# Patient Record
Sex: Female | Born: 1990 | Race: Black or African American | Hispanic: No | Marital: Single | Smoking: Never smoker
Health system: Southern US, Community
[De-identification: ages and names within clinical notes are randomized; demographics above are authoritative.]

## PROBLEM LIST (undated history)

## (undated) ENCOUNTER — Inpatient Hospital Stay (HOSPITAL_COMMUNITY): Payer: Self-pay

## (undated) DIAGNOSIS — E119 Type 2 diabetes mellitus without complications: Secondary | ICD-10-CM

---

## 2012-07-05 ENCOUNTER — Encounter (HOSPITAL_COMMUNITY): Payer: Self-pay | Admitting: Emergency Medicine

## 2012-07-05 ENCOUNTER — Emergency Department (INDEPENDENT_AMBULATORY_CARE_PROVIDER_SITE_OTHER)
Admission: EM | Admit: 2012-07-05 | Discharge: 2012-07-05 | Disposition: A | Discharge status: Home / Self Care | Payer: BC Managed Care – PPO | Source: Home / Self Care | Attending: Family Medicine | Admitting: Family Medicine

## 2012-07-05 ENCOUNTER — Other Ambulatory Visit (HOSPITAL_COMMUNITY)
Admission: RE | Admit: 2012-07-05 | Discharge: 2012-07-05 | Disposition: A | Discharge status: Home / Self Care | Payer: BC Managed Care – PPO | Source: Ambulatory Visit | Attending: Family Medicine | Admitting: Family Medicine

## 2012-07-05 DIAGNOSIS — Z113 Encounter for screening for infections with a predominantly sexual mode of transmission: Secondary | ICD-10-CM | POA: Insufficient documentation

## 2012-07-05 DIAGNOSIS — A6009 Herpesviral infection of other urogenital tract: Secondary | ICD-10-CM

## 2012-07-05 DIAGNOSIS — N760 Acute vaginitis: Secondary | ICD-10-CM | POA: Insufficient documentation

## 2012-07-05 DIAGNOSIS — A6000 Herpesviral infection of urogenital system, unspecified: Secondary | ICD-10-CM

## 2012-07-05 HISTORY — DX: Type 2 diabetes mellitus without complications: E11.9

## 2012-07-05 LAB — POCT URINALYSIS DIP (DEVICE)
Bilirubin Urine: NEGATIVE
Glucose, UA: 250 mg/dL — AB
Specific Gravity, Urine: >=1.03 (ref 1.005–1.030)

## 2012-07-05 LAB — POCT PREGNANCY, URINE: Preg Test, Ur: NEGATIVE

## 2012-07-05 MED ORDER — VALACYCLOVIR HCL 1 G PO TABS: 1000.0000 mg | ORAL_TABLET | Freq: Two times a day (BID) | ORAL | Status: AC

## 2012-07-05 NOTE — ED Notes (Signed)
Pt c/o a grayish vag discharge onset Thursday Sx also include: vag swelling and blisters that are painful Also c/o abscess on pubic area Denies: f/v/n/d, hematuria, dysuria LMP: 06/30/12  She is alert and oriented w/no signs of acute distress.

## 2012-07-05 NOTE — ED Notes (Signed)
Call back number verified.  

## 2012-07-05 NOTE — ED Provider Notes (Signed)
History     CSN: [id number]  Arrival date & time 07/05/12  1122   First MD Initiated Contact with Patient 07/05/12 1125      Chief Complaint  Patient presents with  . Vaginal Discharge    HPI Patient is a 22 y.o. female presenting with rash. The history is provided by the patient.  Rash Location:  Ano-genital Ano-genital rash location:  Vulva Quality: blistering, burning, painful and redness   Quality: not draining, not dry and not itchy   Pain details:    Quality:  Sore and stinging   Onset quality:  Sudden   Progression:  Worsening Severity:  Severe Timing:  Constant Progression:  Unchanged Relieved by:  None tried Ineffective treatments:  None tried Associated symptoms: no abdominal pain and no fever    Patient reports 2 day history of painful vaginal lesions. Stay she was recently treated for BV and yeast. Patient denies any history of these vaginal lesions in the past. Admits to unprotected intercourse with a single partner over the last 3 months. Patient also reports painful swollen nodules in bilateral inguinal area since onset of symptoms.   Past Medical History  Diagnosis Date  . Diabetes mellitus without complication     History reviewed. No pertinent past surgical history.  No family history on file.  History  Substance Use Topics  . Smoking status: Never Smoker   . Smokeless tobacco: Not on file  . Alcohol Use: No    OB History   Grav Para Term Preterm Abortions TAB SAB Ect Mult Living                  Review of Systems  Constitutional: Negative for fever.  Gastrointestinal: Negative for abdominal pain.  Skin: Positive for rash.  All other systems reviewed and are negative.    Allergies  Review of patient's allergies indicates no known allergies.  Home Medications   Current Outpatient Rx  Name  Route  Sig  Dispense  Refill  . insulin glargine (LANTUS) 100 UNIT/ML injection   Subcutaneous   Inject into the skin at bedtime.          . insulin lispro (HUMALOG) 100 UNIT/ML injection   Subcutaneous   Inject into the skin 3 (three) times daily before meals.           BP 122/83  Pulse 110  Temp(Src) 99.6 F (37.6 C) (Oral)  Resp 18  SpO2 98%  LMP 06/30/2012  Physical Exam  Constitutional: She appears well-developed and well-nourished.  HENT:  Head: Normocephalic and atraumatic.  Eyes: Conjunctivae are normal.  Neck: Neck supple.  Cardiovascular: Normal rate.   Pulmonary/Chest: Effort normal.  Genitourinary: Uterus normal. No labial fusion. There is rash, tenderness and lesion on the right labia. There is no injury on the right labia. There is rash, tenderness and lesion on the left labia. There is no injury on the left labia. Cervix exhibits no motion tenderness, no discharge and no friability. Right adnexum displays no mass, no tenderness and no fullness. Left adnexum displays no mass, no tenderness and no fullness. There is bleeding around the vagina.  Multiple open vesicular lesion to bilateral labia majora labia minora c/w genital herpes.  Lymphadenopathy:       Right: Inguinal adenopathy present.       Left: Inguinal adenopathy present.    ED Course  Pelvic exam Date/Time: 07/05/2012 1:41 PM Performed by: Leanne Chang Authorized by: Bradd Canary D Consent: Verbal consent obtained.  Risks and benefits: risks, benefits and alternatives were discussed Consent given by: patient Patient understanding: patient states understanding of the procedure being performed Required items: required blood products, implants, devices, and special equipment available Patient identity confirmed: verbally with patient and arm band Preparation: Patient was prepped and draped in the usual sterile fashion. Local anesthesia used: no Patient sedated: no Patient tolerance: Patient tolerated the procedure well with no immediate complications.   (including critical care time)  Labs Reviewed  POCT URINALYSIS DIP  (DEVICE) - Abnormal; Notable for the following:    Glucose, UA 250 (*)    Hgb urine dipstick LARGE (*)    Protein, ur 100 (*)    All other components within normal limits  POCT PREGNANCY, URINE   No results found.   No diagnosis found.    MDM  2 day history of painful vaginal lesions. PE consistent with initial outbreak of genital herpes. Remaining PE unremarkable. Will treat with 10 days of Valtrex and encourage patient to keep her scheduled appointment at [facility] for followup. Safer sex and STIR information provided. Patient verbalizes understanding of instructions and is agreeable.        Leanne Chang, NP 07/05/12 1407

## 2012-07-07 NOTE — ED Provider Notes (Addendum)
Medical screening examination/treatment/procedure(s) were performed by resident physician or non-physician practitioner and as supervising physician I was immediately available for consultation/collaboration.   Barkley Bruns MD.   Linna Hoff, MD 07/07/12 8413  Linna Hoff, MD 07/10/12 [phone]

## 2012-07-10 ENCOUNTER — Telehealth (HOSPITAL_COMMUNITY): Payer: Self-pay | Admitting: *Deleted

## 2012-07-10 MED ORDER — METRONIDAZOLE 500 MG PO TABS: 500.0000 mg | ORAL_TABLET | Freq: Two times a day (BID) | ORAL | Status: DC

## 2012-07-10 NOTE — ED Notes (Signed)
GC/Chlamydia neg., Affirm: Gardnerella pos., Candida and Trich neg.  Order obtained from Dr. Artis Flock for Flagyl.  I called pt. Pt. verified x 2 and given results.  Pt. told she needs Flagyl for bacterial vaginosis.   Pt. instructed to no alcohol while taking this medication.  Pt. wants Rx. called to the [facility] on [address].  Pt. was treated for Herpes but did not get a culture. Pt. instructed to notify her partner. You can pass the virus even when you don't have an outbreak, so always practice safe sex. Get treated for each outbreak with Acyclovir or Valtrex. You may want to get an OB-GYN doctor who can call in a prescription for you when you have an outbreak. Pt. said she has already done that.  Rx. called to [facility] at [phone]. Vassie Moselle 07/10/2012

## 2012-07-14 NOTE — ED Notes (Signed)
Chart review.

## 2012-07-15 ENCOUNTER — Telehealth (HOSPITAL_COMMUNITY): Payer: Self-pay | Admitting: *Deleted

## 2012-07-15 NOTE — ED Notes (Signed)
Pt. called back and is questioning her diagnosis.  She asked to speak to Tama Gander NP. I told her she is part time and is not here now. I asked why she does not accept the diagnosis. She said she told her partner and got tested and it was neg. I asked if he ever had a lesion and she said no.  I asked if he got he HSV 1 and HSV 2 and she said yes and they were neg.  I accessed her chart and told her that Tama Gander made her diagnosis based on her exam. I asked if she got better with the medication and she said yes. I asked if she made a f/u appt with the OB-GYN and she said yes on 5/22. I told her to discuss it with the doctor. Explain that the diagnosis we made was on the clinical exam and no culture was done.  The doctor can check her HSV 1 and 2 and confirm if it was herpes or if she gets another outbreak she can get it cultured at that time. I told her to practice safe sex and always make her partner wear a condom because she can pass ever when she does not have an outbreak.  Pt. voiced understanding. Vassie Moselle 07/15/2012

## 2012-12-03 ENCOUNTER — Ambulatory Visit: Payer: BC Managed Care – PPO | Admitting: *Deleted

## 2012-12-19 ENCOUNTER — Encounter: Payer: BC Managed Care – PPO | Attending: Internal Medicine | Admitting: *Deleted

## 2012-12-19 ENCOUNTER — Encounter: Payer: Self-pay | Admitting: *Deleted

## 2012-12-19 VITALS — Ht 65.5 in | Wt 114.8 lb

## 2012-12-19 DIAGNOSIS — E1065 Type 1 diabetes mellitus with hyperglycemia: Secondary | ICD-10-CM | POA: Insufficient documentation

## 2012-12-19 DIAGNOSIS — Z713 Dietary counseling and surveillance: Secondary | ICD-10-CM | POA: Insufficient documentation

## 2012-12-19 DIAGNOSIS — IMO0002 Reserved for concepts with insufficient information to code with codable children: Secondary | ICD-10-CM

## 2012-12-19 NOTE — Progress Notes (Signed)
Appt start time: 0830 end time:  1000.  Assessment:  Patient was seen on  12/19/12 for individual diabetes education. 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 She lives with room mate in apartment, but she shops and cooks for herself. 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 Works at [employer] with variable hours. SMBG 3-5 times a day. Has been on Animus pump in the past, prefers pens at this time.   Current HbA1c: 13.7% on 02/06/2012  MEDICATIONS: see list. Diabetes medications are Lantus and Humalog Kwikpen   DIETARY INTAKE:  24-hr recall:  B ( AM): omelet with lots of veggies and meat in it OR grits, eggs OR pancakes OR oatmeal with sugar and butter vs. Flavored, water to drink Snk ( AM): varies - granola bar OR cookies OR chips  L ( PM): at home more often lately: left overs OR sandwich with fruit, water Snk ( PM): varies, same as AM D ( PM): meat, starch, vegetables OR casserole meal,  Snk ( PM): not usually, due to late dinner Beverages: water or sports drinks  Usual physical activity: walks to bus stop for work and school  Estimated energy needs: 1500 calories 170 g carbohydrates 112 g protein 42 g fat  Progress Towards Goal(s):  In progress.   Nutritional Diagnosis:  NB-1.1 Food and nutrition-related knowledge deficit As related to control diabetes.  As evidenced by A1c of 13.7%.    Intervention:  Nutrition counseling provided.  Discussed diabetes disease process and treatment options.  Discussed physiology of diabetes and difference between type 1 and type 2. Discussed role of medications and diet in glucose control  Provided education on macronutrients on glucose levels.  Provided education on carb counting, importance of regularly scheduled meals/snacks, and meal planning  Discussed effects of physical activity on glucose levels and long-term glucose control.    Review patient  medications.  Discussed role of medication on blood glucose and possible side effects  Discussed blood glucose monitoring and interpretation.  Discussed recommended target ranges and individual ranges.    Described short-term complications: hyper- and hypo-glycemia.  Discussed causes,symptoms, and treatment options.  At next visit plan to discuss:  Prevention, detection, and treatment of long-term complications.  Discussed the role of prolonged elevated glucose levels on body systems.  Role of stress on blood glucose levels and discussed strategies to manage psychosocial issues.  Recommendations for long-term diabetes self-care.  Established checklist for medical, dental, and emotional self-care.  We discussed the potential for Dexcom CGM, she is not ready to pursue that at this time. Will discuss further at next visit.  Handouts given during visit include: Living Well with Diabetes Carb Counting and Food Label handouts Meal Plan Card  Plan:  Consider using pure carbohydrate for treating low BG rather than higher fat foods Continue reading food labels for Total Carbohydrate and Fat Grams of foods Continue with your current activity level by walking daily as tolerated Consider cooking larger portions of meat or other parts of meal to freeze and save time in the kitchen  Barriers to learning/adherance to lifestyle change: busy schedule and probable denial of chronic disease management needs  Diabetes self-care support plan:   [facility] DM1 support group  Monitoring/Evaluation:  Dietary intake, exercise, SMBG, and body weight in 2 month(s).

## 2012-12-19 NOTE — Patient Instructions (Signed)
Plan:  Consider using pure carbohydrate for treating low BG rather than higher fat foods Continue reading food labels for Total Carbohydrate and Fat Grams of foods Continue with your current activity level by walking daily as tolerated Consider cooking larger portions of meat or other parts of meal to freeze and save time in the kitchen

## 2013-02-18 ENCOUNTER — Ambulatory Visit: Payer: BC Managed Care – PPO | Admitting: *Deleted

## 2013-03-06 ENCOUNTER — Encounter: Payer: BC Managed Care – PPO | Attending: Internal Medicine | Admitting: *Deleted

## 2013-03-06 DIAGNOSIS — IMO0002 Reserved for concepts with insufficient information to code with codable children: Secondary | ICD-10-CM

## 2013-03-06 DIAGNOSIS — Z713 Dietary counseling and surveillance: Secondary | ICD-10-CM | POA: Insufficient documentation

## 2013-03-06 DIAGNOSIS — E1065 Type 1 diabetes mellitus with hyperglycemia: Secondary | ICD-10-CM | POA: Insufficient documentation

## 2013-03-06 NOTE — Progress Notes (Signed)
Appt start time: 0830 end time:  0930.  Assessment:  Patient was seen on  03/06/13 for individual diabetes education follow up visit. She states she has met with Dr. Talmage Coin and he has explained that with her complicated work schedule that going back on the insulin pump could be very helpful in improving her control. She tells me that when on the Animas pump before, she used the basic bolus features but not any advanced features such as Combo Bolus, Temporary Basal etc. Her difficulty with the pump in the past was scar tissue made the site painful. She would also like to know more about the new technology available with current pumps. She states she has had an A1c recently and it has improved by 2% points, she did not give me the actual number at this time.  Current HbA1c: 13.7% on 02/06/2012, she states it has improved by 2 % this past month  MEDICATIONS: see list. Diabetes medications are Lantus and Humalog Kwikpen   DIETARY INTAKE:  24-hr recall:  B ( AM): omelet with lots of veggies and meat in it OR grits, eggs OR pancakes OR oatmeal with sugar and butter vs. Flavored, water to drink Snk ( AM): varies - granola bar OR cookies OR chips  L ( PM): at home more often lately: left overs OR sandwich with fruit, water Snk ( PM): varies, same as AM D ( PM): meat, starch, vegetables OR casserole meal,  Snk ( PM): not usually, due to late dinner Beverages: water or sports drinks  Usual physical activity: walks to bus stop for work and school  Estimated energy needs: 1500 calories 170 g carbohydrates 112 g protein 42 g fat  Progress Towards Goal(s):  In progress.   Nutritional Diagnosis:  NB-1.1 Food and nutrition-related knowledge deficit As related to control diabetes.  As evidenced by A1c of 13.7%.    Intervention:    Reviewed difference between delivery of insulin via syringe/pen compared to insulin pump. Reviewed improved insulin delivery via pump due to improved accuracy of  dose and flexibility of adjusting bolus insulin based on carb intake and BG correction. Described advanced features including Temp Basal, Patterns, Combo Bolus for high fat meals and Insulin On Board Discussed potential CGM from either Dexcom with the new Vibe Animas Pump and Medtronic 530G with Enlyte sensors. Explained importance of testing BG at least 4 times per day for appropriate correction of high BG and prevention of DKA as applicable. Discussed snack options per her request, some of which would not require insulin.  Monitoring/Evaluation:   Patient does want to continue with pursuit of insulin pump.  Plan:  Patient instructed to contact both pump companies to determine her out of pocket cost based on her insurance coverage. Patient instructed to go to both pump web-sites for more detailed information.  I have provided you with a Snack List, (the protein and fat will help with hunger without needing insulin).   Barriers to learning/adherance to lifestyle change: appears improved since last visit  Diabetes self-care support plan:   [facility] DM1 support group  Monitoring/Evaluation:  Dietary intake, exercise, SMBG, and body weight PRN, and to contact me for training date when pump / CGM is ordered.

## 2013-03-06 NOTE — Patient Instructions (Signed)
Plan: Consider calling Yvonne Kendall to find out your insurance coverage for both the Ping and the Vibe insulin pumps Also ask about coverage for sensors (Continuous Glucose Monitoring = CGM) I have provided you with a Snack List, (the protein and fat will help with hunger without needing insulin).

## 2014-01-12 ENCOUNTER — Ambulatory Visit (INDEPENDENT_AMBULATORY_CARE_PROVIDER_SITE_OTHER): Payer: BC Managed Care – HMO | Admitting: Physician Assistant

## 2014-01-12 VITALS — BP 112/78 | HR 71 | Temp 98.4°F | Resp 18 | Ht 65.0 in | Wt 122.4 lb

## 2014-01-12 DIAGNOSIS — E10628 Type 1 diabetes mellitus with other skin complications: Secondary | ICD-10-CM

## 2014-01-12 DIAGNOSIS — E109 Type 1 diabetes mellitus without complications: Secondary | ICD-10-CM | POA: Insufficient documentation

## 2014-01-12 DIAGNOSIS — IMO0002 Reserved for concepts with insufficient information to code with codable children: Secondary | ICD-10-CM

## 2014-01-12 DIAGNOSIS — K651 Peritoneal abscess: Secondary | ICD-10-CM

## 2014-01-12 LAB — POCT CBC
GRANULOCYTE PERCENT: 59.2 % (ref 37–80)
HEMATOCRIT: 44 % (ref 37.7–47.9)
Hemoglobin: 13.7 g/dL (ref 12.2–16.2)
Lymph, poc: 3.3 (ref 0.6–3.4)
MCH, POC: 28.8 pg (ref 27–31.2)
MCHC: 31.1 g/dL — AB (ref 31.8–35.4)
MCV: 92.8 fL (ref 80–97)
MID (cbc): 0.6 (ref 0–0.9)
MPV: 10.4 fL (ref 0–99.8)
POC GRANULOCYTE: 5.6 (ref 2–6.9)
POC LYMPH %: 35 % (ref 10–50)
POC MID %: 5.8 %M (ref 0–12)
Platelet Count, POC: 304 10*3/uL (ref 142–424)
RBC: 4.74 M/uL (ref 4.04–5.48)
RDW, POC: 15.9 %
WBC: 9.5 10*3/uL (ref 4.6–10.2)

## 2014-01-12 LAB — GLUCOSE, POCT (MANUAL RESULT ENTRY): POC GLUCOSE: 271 mg/dL — AB (ref 70–99)

## 2014-01-12 MED ORDER — DOXYCYCLINE HYCLATE 100 MG PO CAPS: 100.0000 mg | ORAL_CAPSULE | Freq: Two times a day (BID) | ORAL | Status: DC

## 2014-01-12 NOTE — Progress Notes (Signed)
Subjective:    Patient ID: Sara Warren, female    DOB: 10/17/1990, 23 y.o.   MRN: [id number]  HPI Patient with PMH of type 1 diabetes controlled with insulin pump presents with a 1 week painful bump former injection site. Removed 7-8 days ago. Has not seen any pus. Has changed pump twice since has had bump and home glucose has been higher than usual with numbers in the 400 and 500s. Usual numbers are in 200; goal is <250. Denies fever, sweating, fatigue, HA, or N/V. No recent changes in diet. No med allergies.   Review of Systems  Constitutional: Negative for fever, chills, diaphoresis, activity change, appetite change and fatigue.  Eyes: Negative for photophobia and visual disturbance.  Respiratory: Negative for shortness of breath.   Cardiovascular: Negative for chest pain and palpitations.  Gastrointestinal: Positive for constipation (baseline). Negative for nausea, vomiting, abdominal pain, diarrhea and abdominal distention.  Skin: Negative for pallor, rash and wound.       Bump on lower abdomen. No pus.  Allergic/Immunologic: Negative for environmental allergies and food allergies.  Neurological: Negative for dizziness, weakness, light-headedness and headaches.  Hematological: Negative for adenopathy.       Objective:   Physical Exam  Constitutional: She is oriented to person, place, and time. She appears well-developed. No distress.  Blood pressure 112/78, pulse 71, temperature 98.4 F (36.9 C), temperature source Oral, resp. rate 18, height 5\' 5"  (1.651 m), weight 122 lb 6.4 oz (55.52 kg), SpO2 98.00%.   HENT:  Head: Normocephalic and atraumatic.  Eyes: Conjunctivae are normal. Pupils are equal, round, and reactive to light. Right eye exhibits no discharge. Left eye exhibits no discharge.  Neck: Neck supple.  Cardiovascular: Normal rate, regular rhythm and normal heart sounds.  Exam reveals no gallop and no friction rub.   No murmur heard. Pulmonary/Chest: Effort normal and  breath sounds normal.  Abdominal: Soft. Bowel sounds are normal. She exhibits no distension and no mass. There is tenderness (at location of lesion only).  Lymphadenopathy:    She has no cervical adenopathy.  Neurological: She is alert and oriented to person, place, and time.  Skin: Skin is warm and dry. No rash noted. She is not diaphoretic. No erythema. No pallor.  3cm lesion that is raised and not erythematous. Hard without any central fluctuance.    Results for orders placed in visit on 01/12/14  POCT CBC      Result Value Ref Range   WBC 9.5  4.6 - 10.2 K/uL   Lymph, poc 3.3  0.6 - 3.4   POC LYMPH PERCENT 35.0  10 - 50 %L   MID (cbc) 0.6  0 - 0.9   POC MID % 5.8  0 - 12 %M   POC Granulocyte 5.6  2 - 6.9   Granulocyte percent 59.2  37 - 80 %G   RBC 4.74  4.04 - 5.48 M/uL   Hemoglobin 13.7  12.2 - 16.2 g/dL   HCT, POC 40.944.0  81.137.7 - 47.9 %   MCV 92.8  80 - 97 fL   MCH, POC 28.8  27 - 31.2 pg   MCHC 31.1 (*) 31.8 - 35.4 g/dL   RDW, POC 91.415.9     Platelet Count, POC 304  142 - 424 K/uL   MPV 10.4  0 - 99.8 fL  GLUCOSE, POCT (MANUAL RESULT ENTRY)      Result Value Ref Range   POC Glucose 271 (*) 70 - 99 mg/dl  Assessment & Plan:  1. Abdominal abscess - POCT CBC - Doxycycline 100 mg bid x 10days. - RTC if fever develops or area becomes red, warm, or has pus.  2. 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 - POCT glucose (manual entry) - Encouraged to contact endocrinologist tomorrow since home glucose is not a baseline.    Janan Ridgeishira Fount Bahe PA-C  [facility] 01/12/2014 8:55 PM

## 2014-02-14 ENCOUNTER — Emergency Department (HOSPITAL_COMMUNITY)
Admission: EM | Admit: 2014-02-14 | Discharge: 2014-02-14 | Disposition: A | Discharge status: Home / Self Care | Payer: BC Managed Care – PPO | Attending: Emergency Medicine | Admitting: Emergency Medicine

## 2014-02-14 ENCOUNTER — Encounter (HOSPITAL_COMMUNITY): Payer: Self-pay | Admitting: Emergency Medicine

## 2014-02-14 DIAGNOSIS — Z794 Long term (current) use of insulin: Secondary | ICD-10-CM | POA: Diagnosis not present

## 2014-02-14 DIAGNOSIS — Z79899 Other long term (current) drug therapy: Secondary | ICD-10-CM | POA: Diagnosis not present

## 2014-02-14 DIAGNOSIS — E1165 Type 2 diabetes mellitus with hyperglycemia: Secondary | ICD-10-CM | POA: Insufficient documentation

## 2014-02-14 DIAGNOSIS — T7840XA Allergy, unspecified, initial encounter: Secondary | ICD-10-CM

## 2014-02-14 DIAGNOSIS — R739 Hyperglycemia, unspecified: Secondary | ICD-10-CM

## 2014-02-14 LAB — CBC WITH DIFFERENTIAL/PLATELET
BASOS ABS: 0 10*3/uL (ref 0.0–0.1)
Basophils Relative: 0 % (ref 0–1)
Eosinophils Absolute: 0 10*3/uL (ref 0.0–0.7)
Eosinophils Relative: 0 % (ref 0–5)
HEMATOCRIT: 41.7 % (ref 36.0–46.0)
Hemoglobin: 13.8 g/dL (ref 12.0–15.0)
LYMPHS PCT: 31 % (ref 12–46)
Lymphs Abs: 2.6 10*3/uL (ref 0.7–4.0)
MCH: 29.1 pg (ref 26.0–34.0)
MCHC: 33.1 g/dL (ref 30.0–36.0)
MCV: 88 fL (ref 78.0–100.0)
Monocytes Absolute: 0.4 10*3/uL (ref 0.1–1.0)
Monocytes Relative: 5 % (ref 3–12)
NEUTROS ABS: 5.3 10*3/uL (ref 1.7–7.7)
Neutrophils Relative %: 64 % (ref 43–77)
PLATELETS: 376 10*3/uL (ref 150–400)
RBC: 4.74 MIL/uL (ref 3.87–5.11)
RDW: 13 % (ref 11.5–15.5)
WBC: 8.3 10*3/uL (ref 4.0–10.5)

## 2014-02-14 LAB — COMPREHENSIVE METABOLIC PANEL
ALT: 9 U/L (ref 0–35)
AST: 15 U/L (ref 0–37)
Albumin: 3.7 g/dL (ref 3.5–5.2)
Alkaline Phosphatase: 66 U/L (ref 39–117)
Anion gap: 15 (ref 5–15)
BILIRUBIN TOTAL: 0.4 mg/dL (ref 0.3–1.2)
BUN: 13 mg/dL (ref 6–23)
CHLORIDE: 100 meq/L (ref 96–112)
CO2: 22 meq/L (ref 19–32)
Calcium: 9 mg/dL (ref 8.4–10.5)
Creatinine, Ser: 0.81 mg/dL (ref 0.50–1.10)
GFR calc Af Amer: >90 mL/min (ref >90)
Glucose, Bld: 291 mg/dL — ABNORMAL HIGH (ref 70–99)
POTASSIUM: 3.8 meq/L (ref 3.7–5.3)
SODIUM: 137 meq/L (ref 137–147)
Total Protein: 7.5 g/dL (ref 6.0–8.3)

## 2014-02-14 LAB — CBG MONITORING, ED: Glucose-Capillary: 211 mg/dL — ABNORMAL HIGH (ref 70–99)

## 2014-02-14 LAB — I-STAT BETA HCG BLOOD, ED (MC, WL, AP ONLY): I-stat hCG, quantitative: <5 m[IU]/mL (ref <5)

## 2014-02-14 MED ORDER — SODIUM CHLORIDE 0.9 % IV BOLUS (SEPSIS)
2000.0000 mL | Freq: Once | INTRAVENOUS | Status: AC
  Administered 2014-02-14: 2000 mL via INTRAVENOUS

## 2014-02-14 MED ORDER — PREDNISONE 20 MG PO TABS: ORAL_TABLET | ORAL | Status: DC

## 2014-02-14 MED ORDER — FAMOTIDINE IN NACL 20-0.9 MG/50ML-% IV SOLN
20.0000 mg | Freq: Once | INTRAVENOUS | Status: AC
  Administered 2014-02-14: 20 mg via INTRAVENOUS
  Filled 2014-02-14: qty 50

## 2014-02-14 MED ORDER — METHYLPREDNISOLONE SODIUM SUCC 125 MG IJ SOLR
125.0000 mg | Freq: Once | INTRAMUSCULAR | Status: AC
  Administered 2014-02-14: 125 mg via INTRAVENOUS
  Filled 2014-02-14: qty 2

## 2014-02-14 MED ORDER — DIPHENHYDRAMINE HCL 50 MG/ML IJ SOLN
25.0000 mg | Freq: Once | INTRAMUSCULAR | Status: AC
  Administered 2014-02-14: 25 mg via INTRAVENOUS
  Filled 2014-02-14 (×2): qty 1

## 2014-02-14 NOTE — ED Notes (Signed)
CBG 211 

## 2014-02-14 NOTE — ED Provider Notes (Signed)
CSN: [id number]     Arrival date & time 02/14/14  0208 History  This chart was scribed for Richardean Canalavid H Melodee Lupe, MD by Freida Busmaniana Omoyeni, ED Scribe. This patient was seen in room B16C/B16C and the patient's care was started 2:14 AM.    Chief Complaint  Patient presents with  . Allergic Reaction     The history is provided by the patient. No language interpreter was used.     HPI Comments:  Sara Warren is a 23 y.o. female who presents to the Emergency Department complaining of a rash to her back, neck and bilateral armpits that she noticed this am. She states that she woke up itching. She denies SOB and pharyngeal edema. She also denies eating new foods, and exposure to new shampoos/lotions. Pt notes her dog has been scratching lately but is unsure if the dog has fleas. No alleviating factors noted.  Pt has h/o DM. She denies fever, chills, and vomiting.    Past Medical History  Diagnosis Date  . Diabetes mellitus without complication    History reviewed. No pertinent past surgical history. Family History  Problem Relation Age of Onset  . Cancer Brother   . Diabetes Paternal Grandmother    History  Substance Use Topics  . Smoking status: Never Smoker   . Smokeless tobacco: Never Used  . Alcohol Use: No   OB History    No data available     Review of Systems  Constitutional: Negative for fever and chills.  Respiratory: Negative for shortness of breath.   Gastrointestinal: Negative for vomiting.  Skin: Positive for rash.       Pruritus   All other systems reviewed and are negative.     Allergies  Review of patient's allergies indicates no known allergies.  Home Medications   Prior to Admission medications   Medication Sig Start Date End Date Taking? Authorizing Provider  insulin aspart (NOVOLOG) 100 unit/mL injection Inject into the skin 3 x daily with food. Units depend on her blood sugar throughout the day   Yes Historical Provider, MD  doxycycline (VIBRAMYCIN) 100 MG  capsule Take 1 capsule (100 mg total) by mouth 2 (two) times daily. 01/12/14   Tishira R Brewington, PA-C  insulin glargine (LANTUS) 100 UNIT/ML injection Inject into the skin at bedtime.    Historical Provider, MD  insulin lispro (HUMALOG) 100 UNIT/ML injection Inject into the skin 3 (three) times daily before meals.    Historical Provider, MD  metroNIDAZOLE (FLAGYL) 500 MG tablet Take 1 tablet (500 mg total) by mouth 2 (two) times daily. 07/10/12   Linna HoffJames D Kindl, MD  Norethindrone Acetate-Ethinyl Estradiol (JUNEL 1.5/30) 1.5-30 MG-MCG tablet Take 1 tablet by mouth daily.    Historical Provider, MD   BP 111/79 mmHg  Pulse 89  Temp(Src) 98.4 F (36.9 C)  Ht 5\' 5"  (1.651 m)  Wt 120 lb (54.432 kg)  BMI 19.97 kg/m2  SpO2 100%  LMP 02/06/2014 (Exact Date) Physical Exam  Constitutional: She is oriented to person, place, and time. She appears well-developed and well-nourished.  HENT:  Head: Normocephalic and atraumatic.  Slightly dry mucous membranes  Eyes: Conjunctivae are normal.  Cardiovascular: Normal rate, regular rhythm and normal heart sounds.   Pulmonary/Chest: Effort normal and breath sounds normal. No stridor.  Abdominal: She exhibits no distension.  Musculoskeletal: She exhibits no edema.  Neurological: She is alert and oriented to person, place, and time.  Skin: Skin is warm and dry.  Hives noted to chin, bilateral  armpits and back  Psychiatric: She has a normal mood and affect.  Nursing note and vitals reviewed.   ED Course  Procedures   DIAGNOSTIC STUDIES:  Oxygen Saturation is 100% on RA, normal by my interpretation.    COORDINATION OF CARE:  2:21 AM Discussed treatment plan with pt at bedside and pt agreed to plan.  Labs Review Labs Reviewed  COMPREHENSIVE METABOLIC PANEL - Abnormal; Notable for the following:    Glucose, Bld 291 (*)    All other components within normal limits  CBG MONITORING, ED - Abnormal; Notable for the following:    Glucose-Capillary  211 (*)    All other components within normal limits  CBC WITH DIFFERENTIAL  I-STAT BETA HCG BLOOD, ED ([facility], [facility], [facility] ONLY)    Imaging Review No results found.   EKG Interpretation None      MDM   Final diagnoses:  None   Sara Warren is a 23 y.o. female here with possible allergic reaction. Has hives, no airway compromise. Glucose was 291 on chemistry, improved with IVF. Not in DKA. Given steroids, benadryl, hives improved. Will d/c home.   I personally performed the services described in this documentation, which was scribed in my presence. The recorded information has been reviewed and is accurate.    Richardean Canalavid H Jasiyah Poland, MD 02/14/14 [phone]

## 2014-02-14 NOTE — Discharge Instructions (Signed)
Take prednisone as prescribed. It may elevate your blood sugar and you need to adjust your pump accordingly.   Take benadryl 25 mg every 6 hrs as needed for itchiness.  Follow up with your doctor.   Return to ER if you have worse itchiness, rash, throat closing.

## 2014-02-14 NOTE — ED Notes (Signed)
Pt. Refused wheelchair 

## 2014-02-14 NOTE — ED Notes (Signed)
New onset hives since about 30 minutes ago, pt not aware of any allergic. No airway compromise at this time, VSS

## 2014-10-13 LAB — OB RESULTS CONSOLE RUBELLA ANTIBODY, IGM: Rubella: IMMUNE

## 2014-10-13 LAB — OB RESULTS CONSOLE ABO/RH: RH TYPE: NEGATIVE

## 2014-10-13 LAB — OB RESULTS CONSOLE RPR: RPR: NONREACTIVE

## 2014-10-13 LAB — OB RESULTS CONSOLE GC/CHLAMYDIA
Chlamydia: NEGATIVE
Gonorrhea: NEGATIVE

## 2014-10-13 LAB — OB RESULTS CONSOLE HEPATITIS B SURFACE ANTIGEN: HEP B S AG: NEGATIVE

## 2014-10-13 LAB — OB RESULTS CONSOLE HIV ANTIBODY (ROUTINE TESTING): HIV: NONREACTIVE

## 2014-10-13 LAB — OB RESULTS CONSOLE ANTIBODY SCREEN: Antibody Screen: NEGATIVE

## 2014-10-21 ENCOUNTER — Ambulatory Visit (HOSPITAL_COMMUNITY)
Admission: RE | Admit: 2014-10-21 | Discharge: 2014-10-21 | Disposition: A | Discharge status: Home / Self Care | Payer: BLUE CROSS/BLUE SHIELD | Source: Ambulatory Visit | Attending: Internal Medicine | Admitting: Internal Medicine

## 2014-10-21 ENCOUNTER — Encounter: Payer: BLUE CROSS/BLUE SHIELD | Attending: Internal Medicine | Admitting: *Deleted

## 2014-10-21 DIAGNOSIS — E109 Type 1 diabetes mellitus without complications: Secondary | ICD-10-CM | POA: Diagnosis not present

## 2014-10-21 DIAGNOSIS — Z3A00 Weeks of gestation of pregnancy not specified: Secondary | ICD-10-CM | POA: Diagnosis not present

## 2014-10-21 DIAGNOSIS — O24011 Pre-existing diabetes mellitus, type 1, in pregnancy, first trimester: Secondary | ICD-10-CM

## 2014-10-21 NOTE — Addendum Note (Signed)
Encounter addended by: Rosendo Gros, RN on: 10/21/2014  4:40 PM<BR>     Documentation filed: Letters

## 2014-10-21 NOTE — Progress Notes (Signed)
Patient presents for Diabetes Consult. Patient has T1DM and pregnant with first child. She notes that in her younger years she was careless in the management of her glucose. She verbalizes the significance of strict glucose management. Her Endocrinologist is Dr. Posey Pronto with [facility]. She has an Animas pump and is awaiting the delivery of her new T-Slim with CGM expected in August. Glucose readings are presently running between 50-213m/dl with an A1c of 7.1%. She sees PPosey Prontoevery 2-2.5 weeks will an appointment tomorrow. Alesha assures me that Dr. PPosey Prontoplans to continue to manage her glucose throughout her pregnancy. Durenda has met with the [facility] with [facility] and received nutritional guidance during her pregnancy. Kerensa has recently started a new job working third shift. Her basal rates in her pump are still on a day shift schedule which I believe is contributing to the highs and lows. Lorita is to discuss this with Dr. PPosey Prontoat her appointment tomorrow (10/21/14). Lavinia requested a schedule to follow with this new work schedule. FBS 1-2pm Eat breakfast            5:00pm  Snack            9:00pm  Lunch            1:00Am  Snack            4:30AM Dinner            9:00AM  Snack and Bed  [facility] Dr. WLisbeth Renshaw

## 2014-10-22 ENCOUNTER — Other Ambulatory Visit (HOSPITAL_COMMUNITY): Payer: Self-pay | Admitting: Obstetrics & Gynecology

## 2014-10-26 ENCOUNTER — Encounter (HOSPITAL_COMMUNITY): Payer: Self-pay

## 2014-10-26 ENCOUNTER — Ambulatory Visit (HOSPITAL_COMMUNITY)
Admission: RE | Admit: 2014-10-26 | Discharge: 2014-10-26 | Disposition: A | Discharge status: Home / Self Care | Payer: BLUE CROSS/BLUE SHIELD | Source: Ambulatory Visit | Attending: Obstetrics & Gynecology | Admitting: Obstetrics & Gynecology

## 2014-10-26 DIAGNOSIS — Z794 Long term (current) use of insulin: Secondary | ICD-10-CM | POA: Diagnosis not present

## 2014-10-26 DIAGNOSIS — O24011 Pre-existing diabetes mellitus, type 1, in pregnancy, first trimester: Secondary | ICD-10-CM | POA: Insufficient documentation

## 2014-10-26 DIAGNOSIS — E109 Type 1 diabetes mellitus without complications: Secondary | ICD-10-CM | POA: Insufficient documentation

## 2014-10-26 DIAGNOSIS — Z3A11 11 weeks gestation of pregnancy: Secondary | ICD-10-CM | POA: Diagnosis not present

## 2014-10-26 NOTE — Consult Note (Signed)
Maternal Fetal Medicine Consultation  Requesting Provider(s): Philip Aspen, DO  Reason for consultation: Type 1 diabetes on insulin pump  HPI: Sara Warren is a 24 yo G2P0010, EDD 05/16/2015 who is currently at 11w 1d seen today for consultation and co-management.  Sara Warren was diagnosed with diabetes in 2009 at age 20 (approx 7 years ago).  She reports that since her diagnosis she has been admitted several times for DKA - most recently in 2013.  She reports ? Diabetic nephropathy - received a call earlier this week that she had significant protein in her 24 hr urine, but results not available at the time of consultation.  Her last evaluation of her eye grounds was several years ago. In the past, she has had very poorly controlled sugars.  In 2014, her HbA1C was in the 13 range.  In April 2016, was 8.7% and most recently 7.1 % in June 2016.  She has an Animas pump - currently being managed by Endocrinology (Dr. Allena Katz).  She is scheduled to get a T-slim pump with continuous glucose monitoring later this month.  Sara Warren reports that Dr. Allena Katz is seeing her about every 2 weeks.  She recently started working 3rd shift and has been having frequent low values.  She was seen by Dr. Allena Katz last week and Basal rates decreased accordingly.  She has a glucagon pen at home and her family members are well-versed on its use.  Her prenatal course has otherwise been uncomplicated.  OB History: OB History    Gravida Para Term Preterm AB TAB SAB Ectopic Multiple Living   1               PMH:  Past Medical History  Diagnosis Date  . Diabetes mellitus without complication   HSV "borderline" elevated TSH - followed by Endocrinology  PSH: neg  Meds: Humolog (insulin pump), Prenatal vitamins, Valtrex  Allergies: No Known Allergies   FH:  Family History  Problem Relation Age of Onset  . Cancer Brother   . Diabetes Paternal Grandmother    Soc:  History   Social History  . Marital Status: Single   Spouse Name: N/A  . Number of Children: N/A  . Years of Education: N/A   Occupational History  . Not on file.   Social History Main Topics  . Smoking status: Never Smoker   . Smokeless tobacco: Never Used  . Alcohol Use: No  . Drug Use: No  . Sexual Activity: Yes    Birth Control/ Protection: Pill, Implant   Other Topics Concern  . Not on file   Social History Narrative    PE:   Filed Vitals:   10/26/14 1021  BP: 131/78  Pulse: 83      A/P: 1) Single IUP at 11w 1d  2) Class F diabetes - Sara Warren was diagnosed with type 1 diabetes in 2009 (approx 7 years ago).  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  She was previously on Lisinopril which has since been discontinued.  We had a long discussion regarding the risks associated with diabetes in pregnancy to include birth defects (risk correlated with diabetic control a the time of conception), risk of fetal macrosomia or IUGR (if vascular disease is present), polyhydramnios, metabolic derangements of the newborn, increased risk of c-section and birth injury and stillbirth.  With her preexisting renal disease, she is a higher risk for preeclampsia and fetal growth restriction.  The patient reports having challenges with low sugars - this is very common in type 1 diabetes in early pregnancy, but she will require higher dosed of insulin as the pregnancy progresses.  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  Recommendations: 1) The patient's insulin pump is being managed by Endocrinology.  She is being seen frequently and the patient seems to happy with the care that she is receiving.  We will not be making adjustments to her insulin pump.  Please contact us if we need to be of further assistance in this particular area. 2) If not already performed, please check a 24-hr urine  protein, creatine clearance and baseline preeclampsia labs 3) Recommend Optometry evaluation of the patient's eye grounds (now greater that 1 year since evaluation) 4) Patient is tentatively scheduled for a detailed anatomy ultrasound with [place] at 18 weeks. 5) Fetal echo at 20-22 weeks (scheduled) 6) Will need serial ultrasounds for growth every 4 weeks  7) Antenatal testing beginning no later that [redacted] weeks gestation 8) In the absence of other complications, delivery by 39 weeks.   Thank you for the opportunity to be a part of the care of Sara Warren. Please contact our office if we can be of further assistance.   I spent approximately 30 minutes with this patient with over 50% of time spent in face-to-face counseling.

## 2014-11-05 ENCOUNTER — Ambulatory Visit (HOSPITAL_COMMUNITY): Payer: Self-pay

## 2014-11-22 ENCOUNTER — Other Ambulatory Visit (HOSPITAL_COMMUNITY): Payer: Self-pay | Admitting: Obstetrics & Gynecology

## 2014-11-22 DIAGNOSIS — Z3689 Encounter for other specified antenatal screening: Secondary | ICD-10-CM

## 2014-11-22 DIAGNOSIS — O24912 Unspecified diabetes mellitus in pregnancy, second trimester: Secondary | ICD-10-CM

## 2014-11-22 DIAGNOSIS — Z3A18 18 weeks gestation of pregnancy: Secondary | ICD-10-CM

## 2014-12-15 ENCOUNTER — Other Ambulatory Visit (HOSPITAL_COMMUNITY): Payer: Self-pay | Admitting: Obstetrics & Gynecology

## 2014-12-15 ENCOUNTER — Encounter (HOSPITAL_COMMUNITY): Payer: Self-pay

## 2014-12-15 ENCOUNTER — Ambulatory Visit (HOSPITAL_COMMUNITY)
Admission: RE | Admit: 2014-12-15 | Discharge: 2014-12-15 | Disposition: A | Discharge status: Home / Self Care | Payer: BLUE CROSS/BLUE SHIELD | Source: Ambulatory Visit | Attending: Obstetrics & Gynecology | Admitting: Obstetrics & Gynecology

## 2014-12-15 DIAGNOSIS — Z36 Encounter for antenatal screening of mother: Secondary | ICD-10-CM | POA: Insufficient documentation

## 2014-12-15 DIAGNOSIS — Z3A18 18 weeks gestation of pregnancy: Secondary | ICD-10-CM | POA: Diagnosis not present

## 2014-12-15 DIAGNOSIS — O24912 Unspecified diabetes mellitus in pregnancy, second trimester: Secondary | ICD-10-CM | POA: Insufficient documentation

## 2014-12-15 DIAGNOSIS — Z3689 Encounter for other specified antenatal screening: Secondary | ICD-10-CM

## 2014-12-15 DIAGNOSIS — E119 Type 2 diabetes mellitus without complications: Secondary | ICD-10-CM | POA: Diagnosis not present

## 2015-01-12 ENCOUNTER — Other Ambulatory Visit (HOSPITAL_COMMUNITY): Payer: Self-pay | Admitting: Obstetrics and Gynecology

## 2015-01-12 ENCOUNTER — Encounter (HOSPITAL_COMMUNITY): Payer: Self-pay | Admitting: Obstetrics & Gynecology

## 2015-01-12 ENCOUNTER — Ambulatory Visit (HOSPITAL_COMMUNITY)
Admission: RE | Admit: 2015-01-12 | Discharge: 2015-01-12 | Disposition: A | Discharge status: Home / Self Care | Payer: Medicaid Other | Source: Ambulatory Visit | Attending: Obstetrics & Gynecology | Admitting: Obstetrics & Gynecology

## 2015-01-12 ENCOUNTER — Encounter (HOSPITAL_COMMUNITY): Payer: Self-pay

## 2015-01-12 VITALS — BP 129/87 | HR 71 | Wt 136.8 lb

## 2015-01-12 DIAGNOSIS — O24912 Unspecified diabetes mellitus in pregnancy, second trimester: Secondary | ICD-10-CM | POA: Insufficient documentation

## 2015-01-12 DIAGNOSIS — O3430 Maternal care for cervical incompetence, unspecified trimester: Secondary | ICD-10-CM

## 2015-01-12 DIAGNOSIS — O3442 Maternal care for other abnormalities of cervix, second trimester: Secondary | ICD-10-CM | POA: Diagnosis not present

## 2015-01-20 ENCOUNTER — Ambulatory Visit (HOSPITAL_COMMUNITY): Payer: Medicaid Other

## 2015-01-26 ENCOUNTER — Other Ambulatory Visit (HOSPITAL_COMMUNITY): Payer: Self-pay | Admitting: Maternal and Fetal Medicine

## 2015-01-26 ENCOUNTER — Ambulatory Visit (HOSPITAL_COMMUNITY)
Admission: RE | Admit: 2015-01-26 | Discharge: 2015-01-26 | Disposition: A | Discharge status: Home / Self Care | Payer: Medicaid Other | Source: Ambulatory Visit | Attending: Obstetrics & Gynecology | Admitting: Obstetrics & Gynecology

## 2015-01-26 DIAGNOSIS — O4410 Placenta previa with hemorrhage, unspecified trimester: Secondary | ICD-10-CM | POA: Diagnosis not present

## 2015-01-26 DIAGNOSIS — O4402 Placenta previa specified as without hemorrhage, second trimester: Secondary | ICD-10-CM

## 2015-01-26 DIAGNOSIS — O24012 Pre-existing diabetes mellitus, type 1, in pregnancy, second trimester: Secondary | ICD-10-CM | POA: Insufficient documentation

## 2015-01-26 DIAGNOSIS — O26872 Cervical shortening, second trimester: Secondary | ICD-10-CM | POA: Insufficient documentation

## 2015-01-26 DIAGNOSIS — Z3A24 24 weeks gestation of pregnancy: Secondary | ICD-10-CM

## 2015-01-26 DIAGNOSIS — O3430 Maternal care for cervical incompetence, unspecified trimester: Secondary | ICD-10-CM

## 2015-02-09 ENCOUNTER — Ambulatory Visit (HOSPITAL_COMMUNITY)
Admission: RE | Admit: 2015-02-09 | Discharge: 2015-02-09 | Disposition: A | Discharge status: Home / Self Care | Payer: Medicaid Other | Source: Ambulatory Visit | Attending: Obstetrics & Gynecology | Admitting: Obstetrics & Gynecology

## 2015-02-09 ENCOUNTER — Encounter (HOSPITAL_COMMUNITY): Payer: Self-pay

## 2015-02-09 ENCOUNTER — Other Ambulatory Visit (HOSPITAL_COMMUNITY): Payer: Self-pay | Admitting: Maternal and Fetal Medicine

## 2015-02-09 DIAGNOSIS — O4410 Placenta previa with hemorrhage, unspecified trimester: Secondary | ICD-10-CM

## 2015-02-09 DIAGNOSIS — O26872 Cervical shortening, second trimester: Secondary | ICD-10-CM

## 2015-02-09 DIAGNOSIS — O24012 Pre-existing diabetes mellitus, type 1, in pregnancy, second trimester: Secondary | ICD-10-CM | POA: Diagnosis present

## 2015-02-09 DIAGNOSIS — Z3A26 26 weeks gestation of pregnancy: Secondary | ICD-10-CM | POA: Insufficient documentation

## 2015-02-23 ENCOUNTER — Ambulatory Visit (HOSPITAL_COMMUNITY): Payer: Medicaid Other | Attending: Obstetrics & Gynecology

## 2015-03-14 ENCOUNTER — Other Ambulatory Visit (HOSPITAL_COMMUNITY): Payer: Self-pay | Admitting: Maternal and Fetal Medicine

## 2015-03-14 ENCOUNTER — Ambulatory Visit (HOSPITAL_COMMUNITY)
Admission: RE | Admit: 2015-03-14 | Discharge: 2015-03-14 | Disposition: A | Discharge status: Home / Self Care | Payer: Medicaid Other | Source: Ambulatory Visit | Attending: Obstetrics and Gynecology | Admitting: Obstetrics and Gynecology

## 2015-03-14 ENCOUNTER — Encounter (HOSPITAL_COMMUNITY): Payer: Self-pay

## 2015-03-14 DIAGNOSIS — O4443 Low lying placenta NOS or without hemorrhage, third trimester: Secondary | ICD-10-CM | POA: Insufficient documentation

## 2015-03-14 DIAGNOSIS — O26873 Cervical shortening, third trimester: Secondary | ICD-10-CM | POA: Insufficient documentation

## 2015-03-14 DIAGNOSIS — O4440 Low lying placenta NOS or without hemorrhage, unspecified trimester: Secondary | ICD-10-CM

## 2015-03-14 DIAGNOSIS — Z3A31 31 weeks gestation of pregnancy: Secondary | ICD-10-CM

## 2015-03-14 DIAGNOSIS — O24013 Pre-existing diabetes mellitus, type 1, in pregnancy, third trimester: Secondary | ICD-10-CM | POA: Insufficient documentation

## 2015-03-14 DIAGNOSIS — O26872 Cervical shortening, second trimester: Secondary | ICD-10-CM

## 2015-03-16 ENCOUNTER — Ambulatory Visit (HOSPITAL_COMMUNITY): Payer: BLUE CROSS/BLUE SHIELD

## 2015-03-25 ENCOUNTER — Inpatient Hospital Stay (HOSPITAL_COMMUNITY)
Admission: AD | Admit: 2015-03-25 | Discharge: 2015-03-25 | Disposition: A | Discharge status: Home / Self Care | Payer: BLUE CROSS/BLUE SHIELD | Source: Ambulatory Visit | Attending: Obstetrics | Admitting: Obstetrics

## 2015-03-25 NOTE — MAU Note (Signed)
Dr. Chestine Sporelark came in for consultation, patient did not need to be seen in MAU.

## 2015-03-27 NOTE — L&D Delivery Note (Signed)
Pt completed the first stage without difficulty. She pushed for one hour. She had a SVD of one live viable black female over a second degree midline tear in the LOA position. Nuchal arm x 1. Placenta S/I. EBL-400cc. Baby to NBN. EBL-400cc. Bab to NBN. Tear closed with 30 chromic.

## 2015-04-12 ENCOUNTER — Ambulatory Visit (HOSPITAL_COMMUNITY): Payer: Medicaid Other

## 2015-04-13 LAB — OB RESULTS CONSOLE GBS: GBS: NEGATIVE

## 2015-04-14 ENCOUNTER — Ambulatory Visit (HOSPITAL_COMMUNITY)
Admission: RE | Admit: 2015-04-14 | Discharge: 2015-04-14 | Disposition: A | Discharge status: Home / Self Care | Payer: BLUE CROSS/BLUE SHIELD | Source: Ambulatory Visit | Attending: Obstetrics and Gynecology | Admitting: Obstetrics and Gynecology

## 2015-04-14 ENCOUNTER — Encounter (HOSPITAL_COMMUNITY): Payer: Self-pay

## 2015-04-14 DIAGNOSIS — O4443 Low lying placenta NOS or without hemorrhage, third trimester: Secondary | ICD-10-CM | POA: Diagnosis not present

## 2015-04-14 DIAGNOSIS — Z3A35 35 weeks gestation of pregnancy: Secondary | ICD-10-CM | POA: Diagnosis not present

## 2015-04-14 DIAGNOSIS — O24013 Pre-existing diabetes mellitus, type 1, in pregnancy, third trimester: Secondary | ICD-10-CM | POA: Diagnosis present

## 2015-04-14 DIAGNOSIS — O26873 Cervical shortening, third trimester: Secondary | ICD-10-CM | POA: Insufficient documentation

## 2015-05-02 ENCOUNTER — Encounter (HOSPITAL_COMMUNITY): Payer: Self-pay | Admitting: *Deleted

## 2015-05-02 ENCOUNTER — Encounter (HOSPITAL_COMMUNITY): Payer: Self-pay | Admitting: Anesthesiology

## 2015-05-02 ENCOUNTER — Inpatient Hospital Stay (HOSPITAL_COMMUNITY)
Admission: AD | Admit: 2015-05-02 | Discharge: 2015-05-06 | DRG: 774 | Disposition: A | Discharge status: Home / Self Care | Payer: Medicaid Other | Source: Ambulatory Visit | Attending: Obstetrics and Gynecology | Admitting: Obstetrics and Gynecology

## 2015-05-02 DIAGNOSIS — Z9641 Presence of insulin pump (external) (internal): Secondary | ICD-10-CM | POA: Diagnosis present

## 2015-05-02 DIAGNOSIS — O1494 Unspecified pre-eclampsia, complicating childbirth: Secondary | ICD-10-CM | POA: Diagnosis present

## 2015-05-02 DIAGNOSIS — O133 Gestational [pregnancy-induced] hypertension without significant proteinuria, third trimester: Secondary | ICD-10-CM

## 2015-05-02 DIAGNOSIS — O1213 Gestational proteinuria, third trimester: Secondary | ICD-10-CM

## 2015-05-02 DIAGNOSIS — Z3A38 38 weeks gestation of pregnancy: Secondary | ICD-10-CM

## 2015-05-02 DIAGNOSIS — E109 Type 1 diabetes mellitus without complications: Secondary | ICD-10-CM | POA: Diagnosis present

## 2015-05-02 DIAGNOSIS — O2402 Pre-existing diabetes mellitus, type 1, in childbirth: Principal | ICD-10-CM | POA: Diagnosis present

## 2015-05-02 DIAGNOSIS — IMO0001 Reserved for inherently not codable concepts without codable children: Secondary | ICD-10-CM | POA: Diagnosis present

## 2015-05-02 LAB — CBC
HEMATOCRIT: 32.4 % — AB (ref 36.0–46.0)
HEMOGLOBIN: 10.9 g/dL — AB (ref 12.0–15.0)
MCH: 29.1 pg (ref 26.0–34.0)
MCHC: 33.6 g/dL (ref 30.0–36.0)
MCV: 86.6 fL (ref 78.0–100.0)
Platelets: 202 10*3/uL (ref 150–400)
RBC: 3.74 MIL/uL — ABNORMAL LOW (ref 3.87–5.11)
RDW: 13.7 % (ref 11.5–15.5)
WBC: 6.9 10*3/uL (ref 4.0–10.5)

## 2015-05-02 LAB — COMPREHENSIVE METABOLIC PANEL
ALK PHOS: 221 U/L — AB (ref 38–126)
ALT: 12 U/L — ABNORMAL LOW (ref 14–54)
ANION GAP: 9 (ref 5–15)
AST: 23 U/L (ref 15–41)
Albumin: 2.2 g/dL — ABNORMAL LOW (ref 3.5–5.0)
BILIRUBIN TOTAL: 0.4 mg/dL (ref 0.3–1.2)
BUN: 7 mg/dL (ref 6–20)
CALCIUM: 8.1 mg/dL — AB (ref 8.9–10.3)
CO2: 22 mmol/L (ref 22–32)
Chloride: 107 mmol/L (ref 101–111)
Creatinine, Ser: 0.88 mg/dL (ref 0.44–1.00)
GFR calc non Af Amer: >60 mL/min (ref >60)
Glucose, Bld: 61 mg/dL — ABNORMAL LOW (ref 65–99)
Potassium: 3.1 mmol/L — ABNORMAL LOW (ref 3.5–5.1)
Sodium: 138 mmol/L (ref 135–145)
TOTAL PROTEIN: 6.8 g/dL (ref 6.5–8.1)

## 2015-05-02 LAB — URIC ACID: URIC ACID, SERUM: 5.8 mg/dL (ref 2.3–6.6)

## 2015-05-02 LAB — PROTEIN / CREATININE RATIO, URINE
CREATININE, URINE: 49 mg/dL
Protein Creatinine Ratio: 2.8 mg/mg{Cre} — ABNORMAL HIGH (ref 0.00–0.15)
Total Protein, Urine: 137 mg/dL

## 2015-05-02 LAB — LACTATE DEHYDROGENASE: LDH: 168 U/L (ref 98–192)

## 2015-05-02 LAB — GLUCOSE, CAPILLARY: GLUCOSE-CAPILLARY: 121 mg/dL — AB (ref 65–99)

## 2015-05-02 LAB — TYPE AND SCREEN
ABO/RH(D): B NEG
Antibody Screen: NEGATIVE

## 2015-05-02 MED ORDER — DEXTROSE IN LACTATED RINGERS 5 % IV SOLN
INTRAVENOUS | Status: DC
  Administered 2015-05-02: 900 mL via INTRAVENOUS
  Administered 2015-05-02 – 2015-05-03 (×2): via INTRAVENOUS

## 2015-05-02 MED ORDER — OXYCODONE-ACETAMINOPHEN 5-325 MG PO TABS: 1.0000 | ORAL_TABLET | ORAL | Status: DC | PRN

## 2015-05-02 MED ORDER — LIDOCAINE HCL (PF) 1 % IJ SOLN
30.0000 mL | INTRAMUSCULAR | Status: DC | PRN
  Filled 2015-05-02: qty 30

## 2015-05-02 MED ORDER — ACETAMINOPHEN 325 MG PO TABS: 650.0000 mg | ORAL_TABLET | ORAL | Status: DC | PRN

## 2015-05-02 MED ORDER — TERBUTALINE SULFATE 1 MG/ML IJ SOLN: 0.2500 mg | Freq: Once | INTRAMUSCULAR | Status: DC | PRN

## 2015-05-02 MED ORDER — OXYCODONE-ACETAMINOPHEN 5-325 MG PO TABS: 2.0000 | ORAL_TABLET | ORAL | Status: DC | PRN

## 2015-05-02 MED ORDER — ZOLPIDEM TARTRATE 5 MG PO TABS: 5.0000 mg | ORAL_TABLET | Freq: Every evening | ORAL | Status: DC | PRN

## 2015-05-02 MED ORDER — LACTATED RINGERS IV SOLN
INTRAVENOUS | Status: DC
  Administered 2015-05-03 (×2): via INTRAVENOUS

## 2015-05-02 MED ORDER — BUTORPHANOL TARTRATE 1 MG/ML IJ SOLN: 1.0000 mg | INTRAMUSCULAR | Status: DC | PRN

## 2015-05-02 MED ORDER — LACTATED RINGERS IV SOLN
2.5000 [IU]/h | INTRAVENOUS | Status: DC
  Administered 2015-05-03: 39.96 [IU]/h via INTRAVENOUS

## 2015-05-02 MED ORDER — OXYTOCIN BOLUS FROM INFUSION: 500.0000 mL | INTRAVENOUS | Status: DC

## 2015-05-02 MED ORDER — OXYTOCIN 10 UNIT/ML IJ SOLN
1.0000 m[IU]/min | INTRAVENOUS | Status: DC
  Administered 2015-05-02: 2 m[IU]/min via INTRAVENOUS
  Filled 2015-05-02: qty 4

## 2015-05-02 MED ORDER — LACTATED RINGERS IV SOLN: 500.0000 mL | INTRAVENOUS | Status: DC | PRN

## 2015-05-02 MED ORDER — ONDANSETRON HCL 4 MG/2ML IJ SOLN
4.0000 mg | Freq: Four times a day (QID) | INTRAMUSCULAR | Status: DC | PRN
  Administered 2015-05-03 (×2): 4 mg via INTRAVENOUS
  Filled 2015-05-02 (×2): qty 2

## 2015-05-02 MED ORDER — CITRIC ACID-SODIUM CITRATE 334-500 MG/5ML PO SOLN: 30.0000 mL | ORAL | Status: DC | PRN

## 2015-05-02 NOTE — MAU Note (Signed)
Ordered pt a dinner tray @ 1910 (carb modified)

## 2015-05-02 NOTE — MAU Note (Signed)
Pt presents to  MAU for [facility] labs. Pt is diabetic and has an insulin pump. Denies any vaginal bleeding or headache

## 2015-05-02 NOTE — MAU Provider Note (Signed)
History     CSN: [id number]  Arrival date and time: 05/02/15 1749   None     No chief complaint on file.  HPI  Sara Warren is 25 y.o. G2P0010 [redacted]w[redacted]d weeks presenting for evaluation of 2+ proteinuria in the office today, seen by Dr. Tenny Craw. She is 4-5 cm dilated in office per Dr. Tenny Craw' report.  She is known diabetic treated with insulin.  She denies elevated blood pressures in pregnancy.  Denies headaches, chest pain, swelling in feet/hands. Blurred vision that lasted several hours this am, from Noon- until 5.    Past Medical History  Diagnosis Date  . Diabetes mellitus without complication (HCC)     History reviewed. No pertinent past surgical history.  Family History  Problem Relation Age of Onset  . Cancer Brother   . Diabetes Paternal Grandmother     Social History  Substance Use Topics  . Smoking status: Never Smoker   . Smokeless tobacco: Never Used  . Alcohol Use: No    Allergies: No Known Allergies  Prescriptions prior to admission  Medication Sig Dispense Refill Last Dose  . doxycycline (VIBRAMYCIN) 100 MG capsule Take 1 capsule (100 mg total) by mouth 2 (two) times daily. (Patient not taking: Reported on 01/12/2015) 20 capsule 0 Not Taking  . insulin aspart (NOVOLOG) 100 unit/mL injection Inject into the skin 3 x daily with food. Units depend on her blood sugar throughout the day   Taking  . insulin glargine (LANTUS) 100 UNIT/ML injection Inject into the skin at bedtime. Reported on 03/14/2015   Not Taking  . insulin lispro (HUMALOG) 100 UNIT/ML injection Inject into the skin 3 (three) times daily before meals. Reported on 03/14/2015   Not Taking  . metroNIDAZOLE (FLAGYL) 500 MG tablet Take 1 tablet (500 mg total) by mouth 2 (two) times daily. (Patient not taking: Reported on 01/12/2015) 14 tablet 0 Not Taking  . Norethindrone Acetate-Ethinyl Estradiol (JUNEL 1.5/30) 1.5-30 MG-MCG tablet Take 1 tablet by mouth daily. Reported on 03/14/2015   Not Taking  . predniSONE  (DELTASONE) 20 MG tablet Take 60 mg daily x 2 days then 40 mg daily x 2 days then 20 mg daily x 2 days (Patient not taking: Reported on 01/12/2015) 12 tablet 0 Not Taking  . Prenatal Vit-Fe Fumarate-FA (PRENATAL VITAMIN PO) Take by mouth.   Taking  . PROGESTERONE VA Place vaginally.   Taking    Review of Systems  Constitutional: Negative for fever and chills.  Eyes: Positive for blurred vision (today).  Cardiovascular: Negative for chest pain and leg swelling.  Neurological: Negative for headaches.   Physical Exam   Blood pressure 127/88, pulse 93, temperature 97.7 F (36.5 C), resp. rate 18, last menstrual period 08/09/2014.  Physical Exam  Nursing note and vitals reviewed. Constitutional: She is oriented to person, place, and time. She appears well-developed and well-nourished. No distress.  HENT:  Head: Normocephalic.  Neck: Normal range of motion.  Cardiovascular: Normal rate.   Respiratory: Effort normal.  Neurological: She is alert and oriented to person, place, and time.  Skin: Skin is warm and dry.  Psychiatric: She has a normal mood and affect. Her behavior is normal. Thought content normal.   Results for orders placed or performed during the hospital encounter of 05/02/15 (from the past 24 hour(s))  Protein / creatinine ratio, urine     Status: Abnormal   Collection Time: 05/02/15  6:00 PM  Result Value Ref Range   Creatinine, Urine 49.00 mg/dL  Total Protein, Urine 137 mg/dL   Protein Creatinine Ratio 2.80 (H) 0.00 - 0.15 mg/mg[Cre]  CBC     Status: Abnormal   Collection Time: 05/02/15  6:31 PM  Result Value Ref Range   WBC 6.9 4.0 - 10.5 K/uL   RBC 3.74 (L) 3.87 - 5.11 MIL/uL   Hemoglobin 10.9 (L) 12.0 - 15.0 g/dL   HCT 09.8 (L) 11.9 - 14.7 %   MCV 86.6 78.0 - 100.0 fL   MCH 29.1 26.0 - 34.0 pg   MCHC 33.6 30.0 - 36.0 g/dL   RDW 82.9 56.2 - 13.0 %   Platelets 202 150 - 400 K/uL  Comprehensive metabolic panel     Status: Abnormal   Collection Time: 05/02/15   6:31 PM  Result Value Ref Range   Sodium 138 135 - 145 mmol/L   Potassium 3.1 (L) 3.5 - 5.1 mmol/L   Chloride 107 101 - 111 mmol/L   CO2 22 22 - 32 mmol/L   Glucose, Bld 61 (L) 65 - 99 mg/dL   BUN 7 6 - 20 mg/dL   Creatinine, Ser 8.65 0.44 - 1.00 mg/dL   Calcium 8.1 (L) 8.9 - 10.3 mg/dL   Total Protein 6.8 6.5 - 8.1 g/dL   Albumin 2.2 (L) 3.5 - 5.0 g/dL   AST 23 15 - 41 U/L   ALT 12 (L) 14 - 54 U/L   Alkaline Phosphatase 221 (H) 38 - 126 U/L   Total Bilirubin 0.4 0.3 - 1.2 mg/dL   GFR calc non Af Amer >60 >60 mL/min   GFR calc Af Amer >60 >60 mL/min   Anion gap 9 5 - 15  Lactate dehydrogenase     Status: None   Collection Time: 05/02/15  6:31 PM  Result Value Ref Range   LDH 168 98 - 192 U/L  Uric acid     Status: None   Collection Time: 05/02/15  6:31 PM  Result Value Ref Range   Uric Acid, Serum 5.8 2.3 - 6.6 mg/dL   MAU Course  Procedures  MDM MSE Labs Exam Increasing Blood pressure readings while in MAU. Reported all to Dr. Yates Decamp given to admit.  Assessment and Plan  A:  Proteinuria      Blurred vision      [redacted]w[redacted]d gestation      Elevated blood pressures      Diabetes Mellitus treated with Insulin   P: ADMIT       Dr. Tenny Craw is putting in orders    Sara Warren,EVE M 05/02/2015, 6:23 PM

## 2015-05-02 NOTE — H&P (Addendum)
Sara Warren is a 25 y.o. female presenting for blurred vision  25 yo Class F diabetic @ 38+0 who presents to MAU from the office for evaluation of blurred vision. In the office today her blood pressure was 98/60 but she c/o blurred vision that has lasted on/off for several hours. Pt had taken her blood sugar and noted it was 160. Pt has a history of proteinuria with a baseline 24 hour urine of 580 mg. Today she was noted to have 2+ protein on an office dipstick.   In MAU her BPs were actually elevated 130s/90-100s and her urine protein: cr ratio returned 2.8. Given the significant worsening of her proteinuria and elevated blood pressure in an Class F diabetic the decision was made to admit the patient for induction of labor  History OB History    Gravida Para Term Preterm AB TAB SAB Ectopic Multiple Living       Past Medical History  Diagnosis Date  . Diabetes mellitus without complication (HCC)    History reviewed. No pertinent past surgical history. Family History: family history includes Cancer in her brother; Diabetes in her paternal grandmother. Social History:  reports that she has never smoked. She has never used smokeless tobacco. She reports that she does not drink alcohol or use illicit drugs.   Prenatal Transfer Tool  Maternal Diabetes: Yes:  Diabetes Type:  Pre-pregnancy Genetic Screening: Declined Maternal Ultrasounds/Referrals: Normal Fetal Ultrasounds or other Referrals:  Fetal echo Maternal Substance Abuse:  No Significant Maternal Medications:  Insulin pump Significant Maternal Lab Results:  None Other Comments:  None  ROS: as above    Blood pressure 138/101, pulse 71, temperature 97.7 F (36.5 C), resp. rate 18, last menstrual period 08/09/2014. Exam Physical Exam  Cvx 4-5/90/-2 Prenatal labs: ABO, Rh:  B negative Antibody:  Negative Rubella:  Immune RPR:   NR HBsAg:   Neg HIV:   NR GBS:   Neg  Assessment/Plan: 1) Admit 2)  Pitocin induction 3) Epidural on request 4) Accu checks Q 4 hours while in latent labor, Q 1 hr in active. Glucomander orders as needed 5) May need magnesium sulfate   Sara Warren. 05/02/2015, 7:59 PM

## 2015-05-03 ENCOUNTER — Inpatient Hospital Stay (HOSPITAL_COMMUNITY): Payer: Medicaid Other | Admitting: Anesthesiology

## 2015-05-03 ENCOUNTER — Encounter (HOSPITAL_COMMUNITY): Payer: Self-pay | Admitting: *Deleted

## 2015-05-03 LAB — GLUCOSE, CAPILLARY
GLUCOSE-CAPILLARY: 109 mg/dL — AB (ref 65–99)
GLUCOSE-CAPILLARY: 124 mg/dL — AB (ref 65–99)
GLUCOSE-CAPILLARY: 212 mg/dL — AB (ref 65–99)
GLUCOSE-CAPILLARY: 178 mg/dL — AB (ref 65–99)
GLUCOSE-CAPILLARY: 231 mg/dL — AB (ref 65–99)
GLUCOSE-CAPILLARY: 213 mg/dL — AB (ref 65–99)
GLUCOSE-CAPILLARY: 188 mg/dL — AB (ref 65–99)
Glucose-Capillary: 126 mg/dL — ABNORMAL HIGH (ref 65–99)
Glucose-Capillary: 161 mg/dL — ABNORMAL HIGH (ref 65–99)
Glucose-Capillary: 221 mg/dL — ABNORMAL HIGH (ref 65–99)
Glucose-Capillary: 139 mg/dL — ABNORMAL HIGH (ref 65–99)
Glucose-Capillary: 122 mg/dL — ABNORMAL HIGH (ref 65–99)
Glucose-Capillary: 134 mg/dL — ABNORMAL HIGH (ref 65–99)
Glucose-Capillary: 270 mg/dL — ABNORMAL HIGH (ref 65–99)
Glucose-Capillary: 242 mg/dL — ABNORMAL HIGH (ref 65–99)

## 2015-05-03 LAB — RPR: RPR Ser Ql: NONREACTIVE

## 2015-05-03 LAB — ABO/RH: ABO/RH(D): B NEG

## 2015-05-03 MED ORDER — DIPHENHYDRAMINE HCL 50 MG/ML IJ SOLN: 12.5000 mg | INTRAMUSCULAR | Status: DC | PRN

## 2015-05-03 MED ORDER — EPHEDRINE 5 MG/ML INJ: 10.0000 mg | INTRAVENOUS | Status: DC | PRN

## 2015-05-03 MED ORDER — DIBUCAINE 1 % RE OINT
1.0000 "application " | TOPICAL_OINTMENT | RECTAL | Status: DC | PRN
  Filled 2015-05-03: qty 28

## 2015-05-03 MED ORDER — WITCH HAZEL-GLYCERIN EX PADS: 1.0000 "application " | MEDICATED_PAD | CUTANEOUS | Status: DC | PRN

## 2015-05-03 MED ORDER — LIDOCAINE HCL (PF) 1 % IJ SOLN
INTRAMUSCULAR | Status: DC | PRN
  Administered 2015-05-03: 5 mL
  Administered 2015-05-03: 3 mL
  Administered 2015-05-03: 5 mL

## 2015-05-03 MED ORDER — LANOLIN HYDROUS EX OINT: TOPICAL_OINTMENT | CUTANEOUS | Status: DC | PRN

## 2015-05-03 MED ORDER — BENZOCAINE-MENTHOL 20-0.5 % EX AERO
1.0000 "application " | INHALATION_SPRAY | CUTANEOUS | Status: DC | PRN
  Filled 2015-05-03 (×2): qty 56

## 2015-05-03 MED ORDER — SENNOSIDES-DOCUSATE SODIUM 8.6-50 MG PO TABS
2.0000 | ORAL_TABLET | ORAL | Status: DC
  Administered 2015-05-04 – 2015-05-05 (×2): 2 via ORAL
  Filled 2015-05-03 (×3): qty 2

## 2015-05-03 MED ORDER — MAGNESIUM SULFATE BOLUS VIA INFUSION
4.0000 g | Freq: Once | INTRAVENOUS | Status: AC
  Administered 2015-05-03: 4 g via INTRAVENOUS
  Filled 2015-05-03: qty 500

## 2015-05-03 MED ORDER — LABETALOL HCL 5 MG/ML IV SOLN
20.0000 mg | INTRAVENOUS | Status: DC | PRN
Start: 2015-05-03 — End: 2015-05-03

## 2015-05-03 MED ORDER — ACETAMINOPHEN 325 MG PO TABS: 650.0000 mg | ORAL_TABLET | ORAL | Status: DC | PRN

## 2015-05-03 MED ORDER — IBUPROFEN 600 MG PO TABS
600.0000 mg | ORAL_TABLET | Freq: Four times a day (QID) | ORAL | Status: DC
  Administered 2015-05-03 – 2015-05-06 (×10): 600 mg via ORAL
  Filled 2015-05-03 (×11): qty 1

## 2015-05-03 MED ORDER — PHENYLEPHRINE 40 MCG/ML (10ML) SYRINGE FOR IV PUSH (FOR BLOOD PRESSURE SUPPORT)
80.0000 ug | PREFILLED_SYRINGE | INTRAVENOUS | Status: DC | PRN
  Filled 2015-05-03: qty 20

## 2015-05-03 MED ORDER — LABETALOL HCL 5 MG/ML IV SOLN
20.0000 mg | Freq: Once | INTRAVENOUS | Status: AC
  Administered 2015-05-03: 20 mg via INTRAVENOUS

## 2015-05-03 MED ORDER — SIMETHICONE 80 MG PO CHEW: 80.0000 mg | CHEWABLE_TABLET | ORAL | Status: DC | PRN

## 2015-05-03 MED ORDER — SODIUM CHLORIDE 0.9 % IV SOLN
INTRAVENOUS | Status: DC
  Administered 2015-05-03: 1 [IU]/h via INTRAVENOUS
  Filled 2015-05-03: qty 2.5

## 2015-05-03 MED ORDER — ONDANSETRON HCL 4 MG PO TABS: 4.0000 mg | ORAL_TABLET | ORAL | Status: DC | PRN

## 2015-05-03 MED ORDER — SODIUM CHLORIDE 0.9 % IV SOLN
INTRAVENOUS | Status: DC
  Filled 2015-05-03: qty 2.5

## 2015-05-03 MED ORDER — FENTANYL 2.5 MCG/ML BUPIVACAINE 1/10 % EPIDURAL INFUSION (WH - ANES)
14.0000 mL/h | INTRAMUSCULAR | Status: DC | PRN
  Administered 2015-05-03 (×2): 14 mL/h via EPIDURAL
  Filled 2015-05-03 (×2): qty 125

## 2015-05-03 MED ORDER — ONDANSETRON HCL 4 MG/2ML IJ SOLN
4.0000 mg | INTRAMUSCULAR | Status: DC | PRN
  Administered 2015-05-03: 4 mg via INTRAVENOUS
  Filled 2015-05-03: qty 2

## 2015-05-03 MED ORDER — ZOLPIDEM TARTRATE 5 MG PO TABS: 5.0000 mg | ORAL_TABLET | Freq: Every evening | ORAL | Status: DC | PRN

## 2015-05-03 MED ORDER — LABETALOL HCL 5 MG/ML IV SOLN
INTRAVENOUS | Status: AC
  Filled 2015-05-03: qty 4

## 2015-05-03 MED ORDER — MAGNESIUM SULFATE 50 % IJ SOLN
2.0000 g/h | INTRAVENOUS | Status: DC
  Administered 2015-05-03: 2 g/h via INTRAVENOUS
  Filled 2015-05-03: qty 80

## 2015-05-03 MED ORDER — TETANUS-DIPHTH-ACELL PERTUSSIS 5-2.5-18.5 LF-MCG/0.5 IM SUSP: 0.5000 mL | Freq: Once | INTRAMUSCULAR | Status: DC

## 2015-05-03 MED ORDER — LACTATED RINGERS IV SOLN
2.0000 g/h | INTRAVENOUS | Status: DC
  Filled 2015-05-03: qty 80

## 2015-05-03 MED ORDER — PRENATAL MULTIVITAMIN CH
1.0000 | ORAL_TABLET | Freq: Every day | ORAL | Status: DC
  Administered 2015-05-04 – 2015-05-06 (×3): 1 via ORAL
  Filled 2015-05-03 (×3): qty 1

## 2015-05-03 MED ORDER — LACTATED RINGERS IV SOLN: INTRAVENOUS | Status: DC

## 2015-05-03 MED ORDER — HYDRALAZINE HCL 20 MG/ML IJ SOLN: 10.0000 mg | Freq: Once | INTRAMUSCULAR | Status: DC | PRN

## 2015-05-03 MED ORDER — DIPHENHYDRAMINE HCL 25 MG PO CAPS: 25.0000 mg | ORAL_CAPSULE | Freq: Four times a day (QID) | ORAL | Status: DC | PRN

## 2015-05-03 NOTE — Progress Notes (Signed)
Pt restarted her own personal insulin pump per that protocol. Will continue to take CBG

## 2015-05-03 NOTE — Progress Notes (Signed)
Talked with Lenor Coffin.  Discussed new settings on insulin pump. Will get in touch with Dr Patel/[facility] to consult.

## 2015-05-03 NOTE — Progress Notes (Signed)
This note also relates to the following rows which could not be included: CBG Lab Component - View only - Cannot attach notes to extension rows SpO2 - Cannot attach notes to unvalidated device data   Pt has insulin pump and states she received .69unit bolus would receive more but had a previous bolus for CBG obtained AC.  Pt states she does not strictly follow a carb modified diet at home although she is supposed to be "watching her carbs".

## 2015-05-03 NOTE — Progress Notes (Signed)
Neonatologist bedside.  Infant has been transferred to NICU for closer observation.

## 2015-05-03 NOTE — Anesthesia Procedure Notes (Signed)
Epidural Patient location during procedure: OB  Staffing Anesthesiologist: Sholanda Croson Performed by: anesthesiologist   Preanesthetic Checklist Completed: patient identified, site marked, surgical consent, pre-op evaluation, timeout performed, IV checked, risks and benefits discussed and monitors and equipment checked  Epidural Patient position: sitting Prep: DuraPrep Patient monitoring: heart rate, continuous pulse ox and blood pressure Approach: right paramedian Location: L3-L4 Injection technique: LOR saline  Needle:  Needle type: Tuohy  Needle gauge: 17 G Needle length: 9 cm and 9 Needle insertion depth: 5 cm Catheter type: closed end flexible Catheter size: 20 Guage Catheter at skin depth: 10 cm Test dose: negative  Assessment Events: blood not aspirated, injection not painful, no injection resistance, negative IV test and no paresthesia  Additional Notes Patient identified. Risks/Benefits/Options discussed with patient including but not limited to bleeding, infection, nerve damage, paralysis, failed block, incomplete pain control, headache, blood pressure changes, nausea, vomiting, reactions to medication both or allergic, itching and postpartum back pain. Confirmed with bedside nurse the patient's most recent platelet count. Confirmed with patient that they are not currently taking any anticoagulation, have any bleeding history or any family history of bleeding disorders. Patient expressed understanding and wished to proceed. All questions were answered. Sterile technique was used throughout the entire procedure. Please see nursing notes for vital signs. Test dose was given through epidural needle and negative prior to continuing to dose epidural or start infusion. Warning signs of high block given to the patient including shortness of breath, tingling/numbness in hands, complete motor block, or any concerning symptoms with instructions to call for help. Patient was given  instructions on fall risk and not to get out of bed. All questions and concerns addressed with instructions to call with any issues.   

## 2015-05-03 NOTE — Progress Notes (Signed)
Pt up to bathroom with steady gait.  Unable to void.  I+O cath completed.

## 2015-05-03 NOTE — Progress Notes (Signed)
Personal insulin pump is off and disconnected and given to mother of patient. Glucostabilizer protocolstarted via 2nd IV in Left FA.

## 2015-05-03 NOTE — Progress Notes (Addendum)
Inpatient Diabetes Program Recommendations  Diabetes Treatment Program Recommendations  ADA Standards of Care 2016 Diabetes in Pregnancy Target Glucose Ranges:  Fasting: 60 - 90 mg/dL Preprandial: 60 - 098 mg/dL 1 hr postprandial: Less than /dL (from first bite of meal) 2 hr postprandial: Less than 120 mg/dL (from first bit of meal)   Review of Glycemic Control  Inpatient Diabetes Program Recommendations:    Noted patient uses insulin pump for glucose control (type 1) managed by Dr Allena Katz of [facility]. Attempted to call patient to assure patient has pump settings adjustment for basal and bolus rates following delivery as her insulin needs will decease tremendously once delivered.  However was told that the patient is actively pushing.b Noted patient on insulin drip per GS presently. Will follow and assist in any way.   Thank you Lenor Coffin, RN, MSN, CDE  Diabetes Inpatient Program Office: [phone] Pager: [phone] 8:00 am to 5:00 pm

## 2015-05-03 NOTE — Anesthesia Preprocedure Evaluation (Signed)
Anesthesia Evaluation  Patient identified by MRN, date of birth, ID band Patient awake    Reviewed: Allergy & Precautions, H&P , NPO status , Patient's Chart, lab work & pertinent test results  History of Anesthesia Complications Negative for: history of anesthetic complications  Airway Mallampati: II  TM Distance: >3 FB Neck ROM: full    Dental no notable dental hx. (+) Teeth Intact   Pulmonary neg pulmonary ROS,    Pulmonary exam normal breath sounds clear to auscultation       Cardiovascular negative cardio ROS Normal cardiovascular exam Rhythm:regular Rate:Normal     Neuro/Psych negative neurological ROS  negative psych ROS   GI/Hepatic negative GI ROS, Neg liver ROS,   Endo/Other  diabetes, Type 1, Insulin Dependent  Renal/GU negative Renal ROS  negative genitourinary   Musculoskeletal   Abdominal   Peds  Hematology negative hematology ROS (+)   Anesthesia Other Findings   Reproductive/Obstetrics (+) Pregnancy                             Anesthesia Physical Anesthesia Plan  ASA: II  Anesthesia Plan: Epidural   Post-op Pain Management:    Induction:   Airway Management Planned:   Additional Equipment:   Intra-op Plan:   Post-operative Plan:   Informed Consent: I have reviewed the patients History and Physical, chart, labs and discussed the procedure including the risks, benefits and alternatives for the proposed anesthesia with the patient or authorized representative who has indicated his/her understanding and acceptance.     Plan Discussed with:   Anesthesia Plan Comments:         Anesthesia Quick Evaluation

## 2015-05-03 NOTE — Progress Notes (Signed)
Pt up to bathroom.  Nausea and vomiting in bathroom.  Medicated for N/V while in bathroom.  IV bolus given.  Assisted back to bed without event.

## 2015-05-03 NOTE — Progress Notes (Signed)
Inpatient Diabetes Program Recommendations  AACE/ADA: New Consensus Statement on Inpatient Glycemic Control (2015)  Target Ranges:  Prepandial:   less than 140 mg/dL      Peak postprandial:   less than 180 mg/dL (1-2 hours)      Critically ill patients:  140 - 180 mg/dL   Review of Glycemic Control  Inpatient Diabetes Program Recommendations:    Spoke with RN and patient regarding patient transition to the insulin pump from the IV insulin per GlucoStabilizer. Pump needs to be resumed 1-2 hours prior to the discontinuation of the insulin drip. Thus, glucose presently in the 200's. However, the amount of  insulin needed during pregnancy is much higher than following delivery as the placental hormones are absent which has caused the insulin resistance during pregnancy. The insulin pump basal and bolus settings that were programmed for pregnancy will be too high in the days following delivery. Patient states she was not given new basal nor bolus settings/rates for post-delivery.  This coordinator has been trying to call Dr Allena Katz at [facility] but unable to get through/past the hold period. Will request patient to call him herself or whoever is on call to get the adjustments. May want to keep alert for potential hypoglycemia until the rates and settings are adjusted.  Have called Marvell Fuller, RN with the office phone number to get the MD or on call MD to assist with pump settings adjustments  Thank you Lenor Coffin, RN, MSN, CDE  Diabetes Inpatient Program Office: [phone] Pager: [phone] 8:00 am to 5:00 pm

## 2015-05-04 LAB — CBC
HEMATOCRIT: 28.6 % — AB (ref 36.0–46.0)
HEMOGLOBIN: 9.7 g/dL — AB (ref 12.0–15.0)
MCH: 29.1 pg (ref 26.0–34.0)
MCHC: 33.9 g/dL (ref 30.0–36.0)
MCV: 85.9 fL (ref 78.0–100.0)
Platelets: 233 10*3/uL (ref 150–400)
RBC: 3.33 MIL/uL — ABNORMAL LOW (ref 3.87–5.11)
RDW: 13.8 % (ref 11.5–15.5)
WBC: 13.2 10*3/uL — ABNORMAL HIGH (ref 4.0–10.5)

## 2015-05-04 LAB — GLUCOSE, CAPILLARY
GLUCOSE-CAPILLARY: 53 mg/dL — AB (ref 65–99)
GLUCOSE-CAPILLARY: 40 mg/dL — AB (ref 65–99)
GLUCOSE-CAPILLARY: 56 mg/dL — AB (ref 65–99)
Glucose-Capillary: 136 mg/dL — ABNORMAL HIGH (ref 65–99)

## 2015-05-04 MED ORDER — INSULIN PUMP
Freq: Three times a day (TID) | SUBCUTANEOUS | Status: DC
  Administered 2015-05-04 – 2015-05-05 (×4): via SUBCUTANEOUS
  Administered 2015-05-05: 5.76 via SUBCUTANEOUS
  Administered 2015-05-05: 7 via SUBCUTANEOUS
  Administered 2015-05-06: 03:00:00 via SUBCUTANEOUS
  Filled 2015-05-04: qty 1

## 2015-05-04 MED ORDER — RHO D IMMUNE GLOBULIN 1500 UNIT/2ML IJ SOSY
300.0000 ug | PREFILLED_SYRINGE | Freq: Once | INTRAMUSCULAR | Status: AC
  Administered 2015-05-04: 300 ug via INTRAVENOUS
  Filled 2015-05-04: qty 2

## 2015-05-04 NOTE — Progress Notes (Signed)
PPD 1 Patient is eating, ambulating, voiding.  Pain control is good.  Pt has signed insulin pump management papers, so will be modifying per recommendations of her endocrinologist. Lochia appropriate, no other complaints.  No HA/Vision changes/RUQ pain.  Filed Vitals:   05/04/15 0700 05/04/15 0800 05/04/15 0900 05/04/15 1123  BP: 123/80 128/82 117/79 147/90  Pulse: 71   74  Temp:  98.3 F (36.8 C)  98.2 F (36.8 C)  TempSrc:  Oral  Oral  Resp:  18  18  Height:      Weight:      SpO2: 100%   100%    Fundus firm Perineum without swelling No Ct  Lab Results  Component Value Date   WBC 13.2* 05/04/2015   HGB 9.7* 05/04/2015   HCT 28.6* 05/04/2015   MCV 85.9 05/04/2015   PLT 233 05/04/2015    --/--/B NEG, B NEG (02/06 2055)  A/P Post partum day 1 Magnesium sulfate discontinued at 24 hrs postpartum, normal and mild BPs, will continue to monitor DM1 on insulin pump.  Blood sugars 53-242.  Pt working with Dr. Allena Katz to adjust pump postpartum. Continue other routine care.  Expect d/c 2/9.    Sara Warren

## 2015-05-04 NOTE — Progress Notes (Signed)
UR chart review completed.  

## 2015-05-04 NOTE — Progress Notes (Signed)
Called and spoke with Neysa Bonito, RN regarding inpatient glycemic control. Patient has Type 1 diabetes and therefore requires insulin. Per Neysa Bonito, RN, patient currently has on insulin pump. Placed order for insulin pump protocol and discuss with Neysa Bonito, Charity fundraiser. Neysa Bonito, RN reports that patient is not follow a Carb Modified diet and glucose has been in the 200's mg/dl since she resumed her insulin pump. Recommend diet be changed to Carb Modified Diet. Asked Neysa Bonito, RN to have oncoming RN for today to instruct patient to call her Endocrinologist for insulin pump setting adjustments. Patient will need to sign the Insulin Pump Contract and use the Patient Insulin Pump FlowSheet to record her glucose, carbohydrates, and insulin pump bolus given for correction and meal coverage. RN should use the patient's Insulin Pump Flow Sheet to chart in EPIC under the INSULIN PUMP Flowsheet every shift. Diabetes Coordinator will continue to follow while inpatient. If RN or MD has any questions or concerns, please page.  Thanks, Orlando Penner, RN, MSN, CDE Diabetes Coordinator Inpatient Diabetes Program [phone] (Team Pager from 8am to 5pm) [phone] ([place] office) [phone] [facility] office) [phone] [facility] office)

## 2015-05-04 NOTE — Progress Notes (Signed)
Hypoglycemic Event  CBG: 38, recheck 40 (patient personal CBG 52)  Treatment: 15 GM carbohydrate snack  patient eating meal   Symptoms: None Pt denies symptoms at this time, denies light headedness, weakness, headache, dizziness   Follow-up CBG: Time: 2134 CBG Result:56  Possible Reasons for Event: Unknown Patient states eating lunch at 1500, Self administered 2.2 unit of Novolog at lunch via insulin pump for BG of 135  Comments/MD notified: Attending physcian Dr. Claiborne Billings called at 2139 to report BG prior to meal, corrective action taken, and 15 minute post-intervention BG.  Reported patient un symptomatic of low blood sugar, denied feeling faint, lightheaded, fatigued, nausea.  Plan of care to consult with endocrinologist in the morning. To call physician if patient becomes symptomatic of low blood sugar.     Sara Warren

## 2015-05-04 NOTE — Anesthesia Postprocedure Evaluation (Signed)
Anesthesia Post Note  Patient: Sara Warren  Procedure(s) Performed: * No procedures listed *  Patient location during evaluation: A-ICU Anesthesia Type: Epidural Level of consciousness: awake and alert Pain management: pain level controlled Vital Signs Assessment: post-procedure vital signs reviewed and stable Respiratory status: spontaneous breathing, nonlabored ventilation and respiratory function stable Cardiovascular status: stable Postop Assessment: no headache, no backache and epidural receding Anesthetic complications: no Comments: Pt stated epidural was good. Her current pain score is a 7 at site of lacerations.    Last Vitals:  Filed Vitals:   05/04/15 0800 05/04/15 0900  BP: 128/82 117/79  Pulse:    Temp: 36.8 C   Resp: 18     Last Pain:  Filed Vitals:   05/04/15 1013  PainSc: Asleep                 Sara Warren

## 2015-05-04 NOTE — Lactation Note (Signed)
This note was copied from a baby's chart. Lactation Consultation Note  Initial visit made.  Breastfeeding consultation services and support information and Providing Breastmilk For Your Baby in NICU booklet given to patient.  Baby is 24 hours old and in the NICU due to low glucose levels.  Mom is a type 1 diabetic.   Mom initially put baby to breast.  RN states she did not desire to pump last night.  Mom willing to pump this AM.  Symphony pump set up with instructions on use and cleaning.  Instructed on hand expression and colostrum drops easily obtained.  Pump initiated and mom voices understanding.  Discussed milk coming to volume and the probability of lowered insulin requirements.  She was aware of this.  Skin to skin and putting baby to breast when possible encouraged.  Patient Name: Sara Warren [id number] Date: 05/04/2015 Reason for consult: Initial assessment;NICU baby   Maternal Data Formula Feeding for Exclusion: Yes Reason for exclusion: Admission to Intensive Care Unit (ICU) post-partum Has patient been taught Hand Expression?: Yes Does the patient have breastfeeding experience prior to this delivery?: No  Feeding    LATCH Score/Interventions                      Lactation Tools Discussed/Used Pump Review: Setup, frequency, and cleaning;Milk Storage Initiated by:: LC Date initiated:: 05/04/15   Consult Status Consult Status: Follow-up Date: 05/05/15 Follow-up type: In-patient    [name] 05/04/2015, 10:59 AM

## 2015-05-05 LAB — RH IG WORKUP (INCLUDES ABO/RH)
ABO/RH(D): B NEG
FETAL SCREEN: NEGATIVE
GESTATIONAL AGE(WKS): 38
Unit division: 0

## 2015-05-05 LAB — GLUCOSE, CAPILLARY
GLUCOSE-CAPILLARY: 108 mg/dL — AB (ref 65–99)
GLUCOSE-CAPILLARY: 183 mg/dL — AB (ref 65–99)
GLUCOSE-CAPILLARY: 185 mg/dL — AB (ref 65–99)
GLUCOSE-CAPILLARY: 244 mg/dL — AB (ref 65–99)
GLUCOSE-CAPILLARY: 258 mg/dL — AB (ref 65–99)
Glucose-Capillary: 115 mg/dL — ABNORMAL HIGH (ref 65–99)
Glucose-Capillary: 167 mg/dL — ABNORMAL HIGH (ref 65–99)
Glucose-Capillary: 252 mg/dL — ABNORMAL HIGH (ref 65–99)
Glucose-Capillary: 38 mg/dL — CL (ref 65–99)

## 2015-05-05 NOTE — Progress Notes (Signed)
Inpatient Diabetes Program Recommendations  AACE/ADA: New Consensus Statement on Inpatient Glycemic Control (2015)  Target Ranges:  Prepandial:   less than 140 mg/dL      Peak postprandial:   less than 180 mg/dL (1-2 hours)      Critically ill patients:  140 - 180 mg/dL   Review of Glycemic Control- Call received from RN, regarding patients insulin pump settings.  Dr. Chestine Spore assisted patient in changing insulin pump basal settings per Dr. Allena Katz.  However later RN told patient about insulin correction factors, and patient accidentally changed basal rates.   Called and discussed with patient by phone and she states that all her basal rates are currently set at 0.8 units/hr.  Also discussed correction and CHO settings.  Patients current insulin to CHO ratio is 1 units for 5 grams of CHO.    Called Dr. Chestine Spore to explain what happened.  She states that patients basal rates should be: 12 a-0.8 units/hr 4a- 0.8 units/hr 8a-0.95 units/hr 12p-1.0 units/hr 6p- 1.2 units/hr  Correction factors: 12a- 42 4a- 42 8a- 36 12p-36 6p-36  Dr. Chestine Spore also states that insulin to CHO ratio can be changed to previous pre-pregnancy ratio of 1 unit/10 grams of CHO.  Called patient back to verify correct insulin pump settings.  Patient adjusted basal rates per MD's pervious order and verified Correction factors through read back.  She also changed her insulin to CHO ratio to 1 unit for every 10 grams of CHO.  Discussed importance of avoidance of hypoglycemia and increased risk with breastfeeding.  She verbalized understanding.  Will be glad to assist as needed.    Thanks, Beryl Meager, RN, BC-ADM Inpatient Diabetes Coordinator Pager [phone] (8a-5p)

## 2015-05-05 NOTE — Progress Notes (Signed)
Patient BG 252.  Instructed to self-administer bolus of Novolog and will recheck in 1 hour. Self-adminstered 4.34 units of Novolog.  Will recheck at 0400.

## 2015-05-05 NOTE — Progress Notes (Signed)
Patient is doing well.  She is ambulating, voiding, tolerating PO.  Pain control is good.  Lochia is appropriate  BPs have been mild range, denies s/sx preeclampsia.   Her insulin pump doses have not been adjusted since delivery, and as a results her BG has been labile from 38-252 I reviewed Dr. Allena Katz (endocrinology's) most recent office note from 1/19 where he clearly outlines a plan for adjusting the basal rate and correction factor after delivery by 20% to start, and further decrease as indicated.     Filed Vitals:   05/04/15 1359 05/04/15 1600 05/04/15 2148 05/05/15 0524  BP: 133/98 130/93 129/91 130/76  Pulse:   80 92  Temp:  98.3 F (36.8 C) 98.1 F (36.7 C) 98.6 F (37 C)  TempSrc:  Oral Oral Oral  Resp:  Height:      Weight:    64.071 kg (141 lb 4 oz)  SpO2:  99% 100% 100%    NAD Fundus firm Ext: no edema  Lab Results  Component Value Date   WBC 13.2* 05/04/2015   HGB 9.7* 05/04/2015   HCT 28.6* 05/04/2015   MCV 85.9 05/04/2015   PLT 233 05/04/2015    --/--/B NEG (02/08 0546)/RImmune  A/P 24 y.o. G2P1011 PPD#2 s/p SVD S/p 24 hr magnesium, BPs stable on no medication Type 1 DM--poorly controlled.  On insulin pump.  We reviewed Dr. Eliane Decree recommended adjustment to her pump settings in the postpartum period and those adjustments were made this AM (starting w 20% decrease in insulin requirement per his recommendation).  I again reviewed the importance of close f/u w Dr. Allena Katz as an outpatient following delivery.   Will monitor BG today, expect d/c tomorrow.   Rh neg--s/p rhogam  Devario Bucklew GEFFEL [employer]

## 2015-05-05 NOTE — Progress Notes (Signed)
CSW acknowledges NICU admission.    Patient screened out for psychosocial assessment since none of the following apply:  Psychosocial stressors documented in mother or baby's chart  Gestation less than 32 weeks  Code at delivery   Infant with anomalies  Please contact the Clinical Social Worker if specific needs arise, or by MOB's request.       

## 2015-05-05 NOTE — Lactation Note (Signed)
This note was copied from a baby's chart. Lactation Consultation Note  Follow up visit with mom.  Baby is at 53 hours and weaning from IV with stable blood sugars.  Baby has been fed formula and expressed breast milk per bottle.  Mom had just finished pumping 40 mls of transitional when I arrived.  She is very pleased with increased volume.  Breasts full but not engorged.  Plan is to meet for next feeding and assist with putting baby to breast.  Patient Name: Sara Warren [id number] Date: 05/05/2015     Maternal Data    Feeding Feeding Type: Breast Milk with Formula added Nipple Type: Slow - flow Length of feed: 20 min  LATCH Score/Interventions                      Lactation Tools Discussed/Used     Consult Status      [name] 05/05/2015, 3:47 PM

## 2015-05-05 NOTE — Progress Notes (Signed)
I visited with Sara Warren during the family support network gathering.  She shared that because of her diabetes she thought her baby might need to be in the NICU, but was surprised that BP was ultimately what sent her to delivery and that the baby had to be admitted to NICU due to some breathing issues and how scary that was. She has the support of her mother and the baby's father. She is in her last year at [employer] and plans to return to school on Monday.  I offered support and encouragement during these days of transition and unknowns and encouraged her to reach out for support.  Will continue to follow.    05/05/15 1600  Clinical Encounter Type  Visited With Patient;Patient and family together  Visit Type Initial;Spiritual support

## 2015-05-06 LAB — GLUCOSE, CAPILLARY
Glucose-Capillary: 210 mg/dL — ABNORMAL HIGH (ref 65–99)
Glucose-Capillary: 95 mg/dL (ref 65–99)

## 2015-05-06 MED ORDER — IBUPROFEN 600 MG PO TABS: 600.0000 mg | ORAL_TABLET | Freq: Four times a day (QID) | ORAL | Status: DC

## 2015-05-06 NOTE — Progress Notes (Signed)
Pt verbalizes understanding of d/c instructions, medications, follow up appts, when to seek medical attention and belongings policy. IV was d/c prior to d/c. Pt has no questions at this time. Pts mother is at bedside and has experience with breastfeeding and seemed to have some understanding of complications, etc which I explained. Pt was given a copy of d/c instructions prior to leaving. Pt is rooming in, in NICU tonight so when she left, she ambulated with her family member to NICU. Sheryn Bison

## 2015-05-06 NOTE — Progress Notes (Signed)
PPD#3 Pt without complaints. States that her BSs have improved. Will discharge to home. B/P diastolics slightly elevated. Will follow as outpt IMP/ Stable Plan/ Discharge to home            RTO 1 week

## 2015-05-06 NOTE — Discharge Summary (Signed)
Obstetric Discharge Summary Reason for Admission: induction of labor Prenatal Procedures: NST and ultrasound Intrapartum Procedures: spontaneous vaginal delivery Postpartum Procedures: none Complications-Operative and Postpartum: none HEMOGLOBIN  Date Value Ref Range Status  05/04/2015 9.7* 12.0 - 15.0 g/dL Final  40/98/1191 47.8 12.2 - 16.2 g/dL Final   HCT  Date Value Ref Range Status  05/04/2015 28.6* 36.0 - 46.0 % Final   HCT, POC  Date Value Ref Range Status  01/12/2014 44.0 37.7 - 47.9 % Final    Physical Exam:  General: alert Lochia: appropriate   Discharge Diagnoses: Preelampsia and Type F DM  Discharge Information: Date: 05/06/2015 Activity: pelvic rest Diet: routine Medications: PNV and Ibuprofen Condition: stable Instructions: refer to practice specific booklet Discharge to: home Follow-up Information    Follow up with Sara Aland, MD. Schedule an appointment as soon as possible for a visit in 1 week.   Specialty:  Obstetrics and Gynecology   Contact information:   [address] [place] [address] [phone]       Newborn Data: Live born female  Birth Weight: 6 lb 5.1 oz (2865 g) APGAR: 6, 9  Home with mother.  Sara Warren E 05/06/2015, 9:21 AM

## 2015-05-06 NOTE — Lactation Note (Signed)
This note was copied from a baby's chart. Lactation Consultation Note  Patient Name: Sara Warren [id number] Date: 05/06/2015 Reason for consult: Follow-up assessment;NICU baby  NICU baby 47 hours old. Mom reports that she is getting over an ounce of EBM when pumping, and the milk appears to be transitioning. Mom reports that she is going to be rooming in with baby in the NICU tonight in preparation for D/C tomorrow. Mom gave permission for this LC to send a BF referral to [facility] office regarding a WIC DEBP, and the referral was faxed. Mom is aware of [facility] loaner program if needed as well. Mom states that the baby has been to breast, but is not nursing well. Enc mom to ask for assistance as needed. Discussed offering the breast with each feeding, then supplementing the baby with EBM/formula as needed, followed by post-pumping and hand expressing. Discussed gradually moving to having baby exclusively at breast as baby able. Enc mom to call [facility] office about DEBP if she does not hear from them later today. Also gave mom paperwork for [facility] loaner.  Maternal Data    Feeding Feeding Type: Formula Nipple Type: Slow - flow  LATCH Score/Interventions                      Lactation Tools Discussed/Used     Consult Status Consult Status: Follow-up Date: 05/07/15 Follow-up type: In-patient    [name] 05/06/2015, 10:38 AM

## 2015-05-07 ENCOUNTER — Ambulatory Visit: Payer: Self-pay

## 2015-05-07 NOTE — Lactation Note (Signed)
This note was copied from a baby's chart. Lactation Consultation Note  Patient Name: Sara Warren [id number] Date: 05/07/2015  [facility] Loan[facility] completed for discharge.     Lendon Ka 05/07/2015, 2:32 PM

## 2015-09-15 ENCOUNTER — Encounter (HOSPITAL_COMMUNITY): Payer: Self-pay

## 2015-09-15 ENCOUNTER — Inpatient Hospital Stay (HOSPITAL_COMMUNITY)
Admission: EM | Admit: 2015-09-15 | Discharge: 2015-09-17 | DRG: 639 | Disposition: A | Discharge status: Home / Self Care | Payer: Medicaid Other | Attending: Internal Medicine | Admitting: Internal Medicine

## 2015-09-15 ENCOUNTER — Other Ambulatory Visit: Payer: Self-pay

## 2015-09-15 DIAGNOSIS — E876 Hypokalemia: Secondary | ICD-10-CM | POA: Diagnosis present

## 2015-09-15 DIAGNOSIS — Z833 Family history of diabetes mellitus: Secondary | ICD-10-CM

## 2015-09-15 DIAGNOSIS — D72829 Elevated white blood cell count, unspecified: Secondary | ICD-10-CM | POA: Diagnosis present

## 2015-09-15 DIAGNOSIS — E1010 Type 1 diabetes mellitus with ketoacidosis without coma: Secondary | ICD-10-CM | POA: Diagnosis present

## 2015-09-15 DIAGNOSIS — Z9119 Patient's noncompliance with other medical treatment and regimen: Secondary | ICD-10-CM | POA: Diagnosis not present

## 2015-09-15 DIAGNOSIS — Z809 Family history of malignant neoplasm, unspecified: Secondary | ICD-10-CM

## 2015-09-15 DIAGNOSIS — E109 Type 1 diabetes mellitus without complications: Secondary | ICD-10-CM | POA: Diagnosis present

## 2015-09-15 DIAGNOSIS — E108 Type 1 diabetes mellitus with unspecified complications: Secondary | ICD-10-CM

## 2015-09-15 DIAGNOSIS — E1110 Type 2 diabetes mellitus with ketoacidosis without coma: Secondary | ICD-10-CM

## 2015-09-15 DIAGNOSIS — R112 Nausea with vomiting, unspecified: Secondary | ICD-10-CM | POA: Diagnosis not present

## 2015-09-15 DIAGNOSIS — R739 Hyperglycemia, unspecified: Secondary | ICD-10-CM | POA: Diagnosis present

## 2015-09-15 DIAGNOSIS — R1110 Vomiting, unspecified: Secondary | ICD-10-CM

## 2015-09-15 LAB — URINALYSIS, ROUTINE W REFLEX MICROSCOPIC
Bilirubin Urine: NEGATIVE
LEUKOCYTES UA: NEGATIVE
NITRITE: NEGATIVE
PROTEIN: NEGATIVE mg/dL
Specific Gravity, Urine: 1.027 (ref 1.005–1.030)
pH: 6 (ref 5.0–8.0)

## 2015-09-15 LAB — CBG MONITORING, ED
GLUCOSE-CAPILLARY: 532 mg/dL — AB (ref 65–99)
GLUCOSE-CAPILLARY: 417 mg/dL — AB (ref 65–99)
Glucose-Capillary: 300 mg/dL — ABNORMAL HIGH (ref 65–99)

## 2015-09-15 LAB — BLOOD GAS, VENOUS
Acid-base deficit: 12.5 mmol/L — ABNORMAL HIGH (ref 0.0–2.0)
Bicarbonate: 14.1 mEq/L — ABNORMAL LOW (ref 20.0–24.0)
O2 Saturation: 66.4 %
PATIENT TEMPERATURE: 98.6
PH VEN: 7.224 — AB (ref 7.250–7.300)
TCO2: 13.2 mmol/L (ref 0–100)
pCO2, Ven: 35.3 mmHg — ABNORMAL LOW (ref 45.0–50.0)
pO2, Ven: 41.4 mmHg (ref 31.0–45.0)

## 2015-09-15 LAB — CBC
HCT: 37.9 % (ref 36.0–46.0)
Hemoglobin: 13.1 g/dL (ref 12.0–15.0)
MCH: 29.2 pg (ref 26.0–34.0)
MCHC: 34.6 g/dL (ref 30.0–36.0)
MCV: 84.4 fL (ref 78.0–100.0)
PLATELETS: 349 10*3/uL (ref 150–400)
RBC: 4.49 MIL/uL (ref 3.87–5.11)
RDW: 13.2 % (ref 11.5–15.5)
WBC: 17.1 10*3/uL — AB (ref 4.0–10.5)

## 2015-09-15 LAB — COMPREHENSIVE METABOLIC PANEL
ALBUMIN: 4.9 g/dL (ref 3.5–5.0)
ALT: 43 U/L (ref 14–54)
AST: 59 U/L — AB (ref 15–41)
Alkaline Phosphatase: 112 U/L (ref 38–126)
Anion gap: 21 — ABNORMAL HIGH (ref 5–15)
BUN: 25 mg/dL — ABNORMAL HIGH (ref 6–20)
CO2: 15 mmol/L — ABNORMAL LOW (ref 22–32)
Calcium: 9.9 mg/dL (ref 8.9–10.3)
Chloride: 95 mmol/L — ABNORMAL LOW (ref 101–111)
Creatinine, Ser: 1.28 mg/dL — ABNORMAL HIGH (ref 0.44–1.00)
GFR calc Af Amer: >60 mL/min (ref >60)
GFR, EST NON AFRICAN AMERICAN: 58 mL/min — AB (ref >60)
Glucose, Bld: 671 mg/dL (ref 65–99)
POTASSIUM: 5.5 mmol/L — AB (ref 3.5–5.1)
Sodium: 131 mmol/L — ABNORMAL LOW (ref 135–145)
TOTAL PROTEIN: 9.5 g/dL — AB (ref 6.5–8.1)
Total Bilirubin: 1.4 mg/dL — ABNORMAL HIGH (ref 0.3–1.2)

## 2015-09-15 LAB — URINE MICROSCOPIC-ADD ON
BACTERIA UA: NONE SEEN
Squamous Epithelial / LPF: NONE SEEN

## 2015-09-15 LAB — LIPASE, BLOOD: LIPASE: 14 U/L (ref 11–51)

## 2015-09-15 MED ORDER — ONDANSETRON HCL 4 MG/2ML IJ SOLN: 4.0000 mg | Freq: Four times a day (QID) | INTRAMUSCULAR | Status: DC | PRN

## 2015-09-15 MED ORDER — SODIUM CHLORIDE 0.9 % IV SOLN: INTRAVENOUS | Status: DC

## 2015-09-15 MED ORDER — DEXTROSE-NACL 5-0.45 % IV SOLN
INTRAVENOUS | Status: DC
  Administered 2015-09-15: via INTRAVENOUS

## 2015-09-15 MED ORDER — SODIUM CHLORIDE 0.9 % IV SOLN
INTRAVENOUS | Status: DC
  Administered 2015-09-15: 5 [IU]/h via INTRAVENOUS

## 2015-09-15 MED ORDER — SODIUM CHLORIDE 0.9 % IV SOLN
INTRAVENOUS | Status: DC
  Administered 2015-09-15: 5.4 [IU]/h via INTRAVENOUS
  Filled 2015-09-15: qty 2.5

## 2015-09-15 MED ORDER — SODIUM CHLORIDE 0.9 % IV BOLUS (SEPSIS)
1000.0000 mL | Freq: Once | INTRAVENOUS | Status: AC
  Administered 2015-09-15: 1000 mL via INTRAVENOUS

## 2015-09-15 MED ORDER — ONDANSETRON HCL 4 MG/2ML IJ SOLN
4.0000 mg | Freq: Once | INTRAMUSCULAR | Status: AC
  Administered 2015-09-15: 4 mg via INTRAVENOUS
  Filled 2015-09-15: qty 2

## 2015-09-15 MED ORDER — DEXTROSE-NACL 5-0.45 % IV SOLN: INTRAVENOUS | Status: DC

## 2015-09-15 NOTE — H&P (Signed)
History and Physical  Sara Warren ZOX:[id number] DOB: 04/11/1990 DOA: 09/15/2015  PCP: Izell CarolinaPATEL,DHAVAL, MD  Patient coming from: home  Chief Complaint: vomiting  HPI:  25 year old woman PMH diabetes mellitus type 1 with intermittent insulin use presented with abdominal pain, nausea or vomiting. Found to have diabetic ketoacidosis.  Patient has been off insulin pump for the last few months. She is not using long-acting insulin. She has been using short acting insulin intermittently. She does not often check her blood sugars. Over the last 24 hours developed abdominal pain, nausea or vomiting with many episodes. She did take insulin last night but is not clear how often over the last week she has taken it. She continued to feel poorly this morning and came to the emergency department.  ED Course: afebrile, VSS, no hypoxia. Treated with 1liter NS, insulin Pertinent labs: glucose 671, potassium 5.5, CO215,creatinine up to1.28,AG 21; LFTs unremarkable; WBC 17.1 EKG: Independently reviewed. SR, no acute changes  Review of Systems:  Negative for sore throat, rash, new muscle aches, chest pain, SOB, dysuria, bleeding,  Positive for subjective fever, chronic blurred vision  Past Medical History  Diagnosis Date  . Diabetes mellitus without complication (HCC)     History reviewed. No pertinent past surgical history.  None per patient   reports that she has never smoked. She has never used smokeless tobacco. She reports that she drinks alcohol. She reports that she does not use illicit drugs.   No Known Allergies  Family History  Problem Relation Age of Onset  . Cancer Brother   . Diabetes Paternal Grandmother      Prior to Admission medications   Medication Sig Start Date End Date Taking? Authorizing Provider  glucagon (GLUCAGON EMERGENCY) 1 MG injection Inject as directed as directed.   Yes Historical Provider, MD  insulin aspart (NOVOLOG) 100 unit/mL injection Inject 1-25 Units into  the skin 3 (three) times daily before meals.    Yes Historical Provider, MD    Physical Exam: Filed Vitals:   09/15/15 1551 09/15/15 1748 09/15/15 1841  BP: 136/88 124/72 115/72  Pulse: 93 86 98  Resp: 16 24 21   Weight: 54.432 kg (120 lb)    SpO2: 100% 100% 100%   Constitutional:  . Appears Ill but not toxic. Calm. Eyes: Pupils and irises appear unremarkable  . Normal  lids ENMT:  . external ears, nose appear normal . grossly normal hearing . Lips appear normal; teeth appear normal Neck:  . neck appears normal, no masses . no thyromegaly Respiratory:  . CTA bilaterally, no w/r/r.  . Respiratory effort normal. No retractions or accessory muscle use Cardiovascular:  . RRR, no m/r/g . No LE extremity edema   . Normal pedal pulses Abdomen:  . Abdomen appears normal; no tenderness or masses . No hernias Musculoskeletal:  . RUE, LUE, RLE, LLE   o strength and tone normal, no atrophy, no abnormal movements o No tenderness, masses Skin:  . No rashes, lesions, ulcers . palpation of skin: no induration or nodules Neurologic:  . Grossly normal Psychiatric:  . judgement and insight appear normal . Mental status o Orientation to person, place, time   Wt Readings from Last 3 Encounters:  09/15/15 54.432 kg (120 lb)  05/06/15 63.05 kg (139 lb)  04/14/15 64.864 kg (143 lb)    I have personally reviewed following labs and imaging studies  Labs on Admission:  CBC:  Recent Labs Lab 09/15/15 1641  WBC 17.1*  HGB 13.1  HCT 37.9  MCV 84.4  PLT 349   Basic Metabolic Panel:  Recent Labs Lab 09/15/15 1641  NA 131*  K 5.5*  CL 95*  CO2 15*  GLUCOSE 671*  BUN 25*  CREATININE 1.28*  CALCIUM 9.9   Liver Function Tests:  Recent Labs Lab 09/15/15 1641  AST 59*  ALT 43  ALKPHOS 112  BILITOT 1.4*  PROT 9.5*  ALBUMIN 4.9    Recent Labs Lab 09/15/15 1641  LIPASE 14   CBG:  Recent Labs Lab 09/15/15 1548  GLUCAP >600*   Urine analysis:      Component Value Date/Time   LABSPEC >=1.030 07/05/2012 1217   PHURINE 5.5 07/05/2012 1217   GLUCOSEU 250* 07/05/2012 1217   HGBUR LARGE* 07/05/2012 1217   BILIRUBINUR NEGATIVE 07/05/2012 1217   KETONESUR NEGATIVE 07/05/2012 1217   PROTEINUR 100* 07/05/2012 1217   UROBILINOGEN 0.2 07/05/2012 1217   NITRITE NEGATIVE 07/05/2012 1217   LEUKOCYTESUR NEGATIVE 07/05/2012 1217    EKG: Independently reviewed. As above  Principal Problem:   DKA (diabetic ketoacidoses) (HCC) Active Problems:   Type 1 diabetes (HCC)   Vomiting   Assessment/Plan 1. DKA secondary to noncompliance. Has not been taking insulin regularly for some time. 2. Abdominal pain, vomiting secondary to DKA. Abdominal exam benign. Lipase and LFTs unremarkable. 3. DM type 1 uncontrolled. Previously on insulin pump but has been off for a few months.   Admit to SDU  Complete second liter IV fluids, then insulin infusion  Serial BMP. Continue insulin infusion until AG clears and bicarbonate normalizes.  Antiemetics.  Consult diabetes RN, dietitian, nursing to assess teaching needs for self administration and blood sugar checks.  Check urine pregnancy  DVT prophylaxis:SCDs Code Status: full code Family Communication:  Disposition Plan: admit SDU    Time spent: 60 minutes  Brendia Sacksaniel Demarr Kluever, MD  [facility] Direct contact: [phone] --Via amion app OR  --[email or web address]; password [email or web address]  7PM-7AM contact night coverage as above  09/15/2015, 7:02 PM

## 2015-09-15 NOTE — ED Notes (Signed)
Pt c/o hyperglycemia, abdominal pain, and emesis.  Pain score 10/10.  Hx of DM type I and has not taken her insulin today.  CBG read "High."  Pt reports that she does not remember the last time she checked her blood sugar.

## 2015-09-15 NOTE — ED Provider Notes (Signed)
CSN: [id number]     Arrival date & time 09/15/15  1517 History   First MD Initiated Contact with Patient 09/15/15 1551     Chief Complaint  Patient presents with  . Hyperglycemia  . Emesis   (Consider location/radiation/quality/duration/timing/severity/associated sxs/prior Treatment) HPI 25 y.o. female with a hx of Diabetes Type I, presents to the Emergency Department today complaining of hyperglycemia with abdominal pain and N/V. Notes emesis beginning last night with no associated abdominal pain. States that she was driving when she began throwing up. Notes pain is generalized. 10/10. No focal pain. Believes pain is related to profuse emesis. No fevers. No CP/SOB. Emesis has been frequent since last night. Pt takes Insulin for diabetes, but had not taken her insulin today due to the emesis. Notes decrease in PO intake. Pt does not remember the last time she checked her blood sugar. Has hx DKA. No other symptoms noted.    Past Medical History  Diagnosis Date  . Diabetes mellitus without complication (HCC)    History reviewed. No pertinent past surgical history. Family History  Problem Relation Age of Onset  . Cancer Brother   . Diabetes Paternal Grandmother    Social History  Substance Use Topics  . Smoking status: Never Smoker   . Smokeless tobacco: Never Used  . Alcohol Use: Yes   OB History    Gravida Para Term Preterm AB TAB SAB Ectopic Multiple Living   2 1 1  0 1 0 0 0 0 1     Review of Systems ROS reviewed and all are negative for acute change except as noted in the HPI.  Allergies  Review of patient's allergies indicates no known allergies.  Home Medications   Prior to Admission medications   Medication Sig Start Date End Date Taking? Authorizing Provider  ibuprofen (ADVIL,MOTRIN) 600 MG tablet Take 1 tablet (600 mg total) by mouth every 6 (six) hours. 05/06/15   Levi AlandMark E Anderson, MD  insulin aspart (NOVOLOG) 100 unit/mL injection Inject 1-25 Units into the skin 3 x  daily with food. 1 unit per 4 g of carbs.    Historical Provider, MD  Prenatal Vit-Fe Fumarate-FA (PRENATAL MULTIVITAMIN) TABS tablet Take 1 tablet by mouth at bedtime.    Historical Provider, MD   BP 136/88 mmHg  Pulse 93  Temp(Src)   Resp 16  Wt 54.432 kg  SpO2 100%  LMP 09/15/2015   Physical Exam  Constitutional: She is oriented to person, place, and time. She appears well-developed and well-nourished.  HENT:  Head: Normocephalic and atraumatic.  Eyes: EOM are normal. Pupils are equal, round, and reactive to light.  Neck: Normal range of motion. Neck supple. No tracheal deviation present.  Cardiovascular: Normal rate, regular rhythm, normal heart sounds and intact distal pulses.   Pulmonary/Chest: Effort normal and breath sounds normal. No respiratory distress. She has no wheezes. She has no rales. She exhibits no tenderness.  Abdominal: Soft. Normal appearance and bowel sounds are normal. There is generalized tenderness. There is no rigidity, no rebound, no guarding, no tenderness at McBurney's point and negative Murphy's sign.  Musculoskeletal: Normal range of motion.  Neurological: She is alert and oriented to person, place, and time.  Skin: Skin is warm and dry.  Psychiatric: She has a normal mood and affect. Her behavior is normal. Thought content normal.  Nursing note and vitals reviewed.  ED Course  Procedures (including critical care time)  CRITICAL CARE Performed by: Eston Estersyler M Deaven Urwin  Total critical care time:  35 minutes  Critical care time was exclusive of separately billable procedures and treating other patients.  Critical care was necessary to treat or prevent imminent or life-threatening deterioration.  Critical care was time spent personally by me on the following activities: development of treatment plan with patient and/or surrogate as well as nursing, discussions with consultants, evaluation of patient's response to treatment, examination of patient, obtaining  history from patient or surrogate, ordering and performing treatments and interventions, ordering and review of laboratory studies, ordering and review of radiographic studies, pulse oximetry and re-evaluation of patient's condition.  Labs Review Labs Reviewed  CBC - Abnormal; Notable for the following:    WBC 17.1 (*)    All other components within normal limits  COMPREHENSIVE METABOLIC PANEL - Abnormal; Notable for the following:    Sodium 131 (*)    Potassium 5.5 (*)    Chloride 95 (*)    CO2 15 (*)    Glucose, Bld 671 (*)    BUN 25 (*)    Creatinine, Ser 1.28 (*)    Total Protein 9.5 (*)    AST 59 (*)    Total Bilirubin 1.4 (*)    GFR calc non Af Amer 58 (*)    Anion gap 21 (*)    All other components within normal limits  BLOOD GAS, VENOUS - Abnormal; Notable for the following:    pH, Ven 7.224 (*)    pCO2, Ven 35.3 (*)    Bicarbonate 14.1 (*)    Acid-base deficit 12.5 (*)    All other components within normal limits  CBG MONITORING, ED - Abnormal; Notable for the following:    Glucose-Capillary >600 (*)    All other components within normal limits  LIPASE, BLOOD  URINALYSIS, ROUTINE W REFLEX MICROSCOPIC (NOT AT [facility])   Imaging Review No results found. I have personally reviewed and evaluated these images and lab results as part of my medical decision-making.   EKG Interpretation   Date/Time:  Thursday September 15 2015 17:48:21 EDT Ventricular Rate:  84 PR Interval:    QRS Duration: 104 QT Interval:  396 QTC Calculation: 469 R Axis:   11 Text Interpretation:  Sinus rhythm Probable left atrial enlargement RSR'  in V1 or V2, right VCD or RVH No old tracing to compare Confirmed by [facility]   MD, DAVID ([id number]) on 09/15/2015 5:55:04 PM      MDM  I have reviewed and evaluated the relevant laboratory values. I have interpreted the relevant EKG. I have reviewed the relevant previous healthcare records. I obtained HPI from historian. Patient discussed with supervising  physician  ED Course:  Assessment: Pt is a 25yF with hx DM Type 1 who presents with hyperglycemia and abdominal pain. Emesis began last night while she was driving. Constant since then. Abdominal pain soon afterwards. On exam, pt actively vomiting. VSS. HR in 90s. Afebrile. Lungs CTA. Heart RRR. Abdomen soft with no focal tenderness. Glucose >600. CBC/CMP with leukocytosis 17.1. Potassium 5.5. Creatinine 1.28. Gap 21. EKG with no acute abnormalities. UA pending. Given fluids in ED. Started on glucose stabilizer. Plan is to admit to medicine in step down.    Disposition/Plan:  Admit to Step Down Pt acknowledges and agrees with plan  Supervising Physician Dione Boozeavid Glick, MD   Final diagnoses:  Diabetic ketoacidosis without coma associated with type 1 diabetes mellitus [facility](HCC)     Audry Piliyler Kyah Buesing, PA-C 09/15/15 1828  Dione Boozeavid Glick, MD 09/15/15 2158

## 2015-09-15 NOTE — Progress Notes (Signed)
[facility].  Patient listed as having Medicaid insurance.  With Medicaid, medication s are generally three dollars or less.  Patient may also request a co pay waiver at her pharmacy if she does not have the money to pay for her medications.  No further EDCm needs at this time.

## 2015-09-15 NOTE — ED Notes (Signed)
Pt presented with a 4 month infant that she is breast feeding. I am unable to take pt to her admission room till the father is back to assume responsibility of the infant. I have informed floor ActuaryCahrge RN and Electrical engineermy Charge RN.

## 2015-09-16 LAB — GLUCOSE, CAPILLARY
GLUCOSE-CAPILLARY: 123 mg/dL — AB (ref 65–99)
GLUCOSE-CAPILLARY: 178 mg/dL — AB (ref 65–99)
GLUCOSE-CAPILLARY: 206 mg/dL — AB (ref 65–99)
GLUCOSE-CAPILLARY: 207 mg/dL — AB (ref 65–99)
GLUCOSE-CAPILLARY: 228 mg/dL — AB (ref 65–99)
GLUCOSE-CAPILLARY: 308 mg/dL — AB (ref 65–99)
GLUCOSE-CAPILLARY: 391 mg/dL — AB (ref 65–99)
Glucose-Capillary: 175 mg/dL — ABNORMAL HIGH (ref 65–99)
Glucose-Capillary: 133 mg/dL — ABNORMAL HIGH (ref 65–99)
Glucose-Capillary: 102 mg/dL — ABNORMAL HIGH (ref 65–99)
Glucose-Capillary: 107 mg/dL — ABNORMAL HIGH (ref 65–99)
Glucose-Capillary: 82 mg/dL (ref 65–99)

## 2015-09-16 LAB — BASIC METABOLIC PANEL
ANION GAP: 7 (ref 5–15)
ANION GAP: 9 (ref 5–15)
Anion gap: 13 (ref 5–15)
BUN: 15 mg/dL (ref 6–20)
BUN: 17 mg/dL (ref 6–20)
BUN: 20 mg/dL (ref 6–20)
CHLORIDE: 110 mmol/L (ref 101–111)
CHLORIDE: 110 mmol/L (ref 101–111)
CHLORIDE: 111 mmol/L (ref 101–111)
CO2: 16 mmol/L — ABNORMAL LOW (ref 22–32)
CO2: 19 mmol/L — ABNORMAL LOW (ref 22–32)
CO2: 20 mmol/L — ABNORMAL LOW (ref 22–32)
Calcium: 8.8 mg/dL — ABNORMAL LOW (ref 8.9–10.3)
Calcium: 8.8 mg/dL — ABNORMAL LOW (ref 8.9–10.3)
Calcium: 9.4 mg/dL (ref 8.9–10.3)
Creatinine, Ser: 0.95 mg/dL (ref 0.44–1.00)
Creatinine, Ser: 1.02 mg/dL — ABNORMAL HIGH (ref 0.44–1.00)
Creatinine, Ser: 1.15 mg/dL — ABNORMAL HIGH (ref 0.44–1.00)
GFR calc Af Amer: >60 mL/min (ref >60)
GFR calc Af Amer: >60 mL/min (ref >60)
GFR calc Af Amer: >60 mL/min (ref >60)
GFR calc non Af Amer: >60 mL/min (ref >60)
GFR calc non Af Amer: >60 mL/min (ref >60)
GFR calc non Af Amer: >60 mL/min (ref >60)
GLUCOSE: 240 mg/dL — AB (ref 65–99)
Glucose, Bld: 163 mg/dL — ABNORMAL HIGH (ref 65–99)
Glucose, Bld: 89 mg/dL (ref 65–99)
POTASSIUM: 3.6 mmol/L (ref 3.5–5.1)
POTASSIUM: 3.7 mmol/L (ref 3.5–5.1)
POTASSIUM: 4.8 mmol/L (ref 3.5–5.1)
SODIUM: 138 mmol/L (ref 135–145)
SODIUM: 138 mmol/L (ref 135–145)
SODIUM: 139 mmol/L (ref 135–145)

## 2015-09-16 LAB — MRSA PCR SCREENING: MRSA BY PCR: NEGATIVE

## 2015-09-16 LAB — PREGNANCY, URINE: PREG TEST UR: NEGATIVE

## 2015-09-16 MED ORDER — INSULIN ASPART 100 UNIT/ML ~~LOC~~ SOLN
4.0000 [IU] | Freq: Three times a day (TID) | SUBCUTANEOUS | Status: DC
  Administered 2015-09-17 (×2): 4 [IU] via SUBCUTANEOUS

## 2015-09-16 MED ORDER — PHENOL 1.4 % MT LIQD
1.0000 | OROMUCOSAL | Status: DC | PRN
  Filled 2015-09-16: qty 177

## 2015-09-16 MED ORDER — INSULIN ASPART 100 UNIT/ML ~~LOC~~ SOLN
0.0000 [IU] | Freq: Three times a day (TID) | SUBCUTANEOUS | Status: DC
  Administered 2015-09-16: 9 [IU] via SUBCUTANEOUS
  Administered 2015-09-16 – 2015-09-17 (×2): 7 [IU] via SUBCUTANEOUS
  Administered 2015-09-17: 2 [IU] via SUBCUTANEOUS

## 2015-09-16 MED ORDER — INSULIN ASPART 100 UNIT/ML ~~LOC~~ SOLN
3.0000 [IU] | Freq: Three times a day (TID) | SUBCUTANEOUS | Status: DC
  Administered 2015-09-16: 3 [IU] via SUBCUTANEOUS
  Administered 2015-09-16: 4 [IU] via SUBCUTANEOUS

## 2015-09-16 MED ORDER — INSULIN GLARGINE 100 UNIT/ML ~~LOC~~ SOLN
12.0000 [IU] | Freq: Every day | SUBCUTANEOUS | Status: DC
  Administered 2015-09-16: 12 [IU] via SUBCUTANEOUS
  Filled 2015-09-16: qty 0.12

## 2015-09-16 MED ORDER — INSULIN GLARGINE 100 UNIT/ML ~~LOC~~ SOLN
7.0000 [IU] | Freq: Every day | SUBCUTANEOUS | Status: DC
  Filled 2015-09-16: qty 0.07

## 2015-09-16 MED ORDER — INSULIN ASPART 100 UNIT/ML ~~LOC~~ SOLN: 0.0000 [IU] | Freq: Every day | SUBCUTANEOUS | Status: DC

## 2015-09-16 MED ORDER — INSULIN GLARGINE 100 UNIT/ML ~~LOC~~ SOLN
7.0000 [IU] | Freq: Once | SUBCUTANEOUS | Status: AC
Start: 2015-09-16 — End: 2015-09-16
  Administered 2015-09-16: 7 [IU] via SUBCUTANEOUS
  Filled 2015-09-16: qty 0.07

## 2015-09-16 NOTE — Progress Notes (Signed)
  PROGRESS NOTE  Sara Warren ZOX:[id number] DOB: 01/27/1991 DOA: 09/15/2015 PCP: Izell CarolinaPATEL,DHAVAL, MD  Brief Narrative: 25 year old woman PMH diabetes mellitus type 1 with intermittent insulin use presented with abdominal pain, nausea or vomiting. Found to have diabetic ketoacidosis. Rapidly improved with standard treatment, now on subcutaneous insulin. Plan 24 hour further observation to assess insulin requirement. Will need prescriptions for long-acting insulin and close outpatient follow-up.  Assessment/Plan: 1. DKA secondary to noncompliance. Has not been taking insulin regularly for some time. Previously on pump several months ago. Unclear what barriers preventing daily use of insulin. 2. Abdominal pain, nausea and vomiting secondary to DKA. Resolved. Abdominal exam benign, lipase and LFTs unremarkable. 3. Diabetes mellitus type 1, uncontrolled.   Much improved.  Continue subcutaneous insulin, assess 24 hour insulin requirement.  Consult diabetes RN, dietitian, instruction on self administration and glucose checks per nursing.  DVT prophylaxis: SCDs Code Status: full Family Communication: none Disposition Plan: home 24 hours  Brendia Sacksaniel Jaequan Propes, MD  [facility] Direct contact: [phone] --Via amion app OR  --[email or web address]; password [email or web address]  7PM-7AM contact night coverage as above 09/16/2015, 9:11 AM  LOS: 1 day   Consultants:    Procedures:    Antimicrobials:    HPI/Subjective: Feels better today. Abdominal pain has resolved. No nausea or vomiting. Tolerating breakfast.  Objective: Filed Vitals:   09/16/15 0242 09/16/15 0435 09/16/15 0459 09/16/15 0800  BP:  114/72    Pulse: 108 97    Temp:  98.2 F (36.8 C) 98.6 F (37 C) 98.4 F (36.9 C)  TempSrc:  Oral Oral Oral  Resp: 26 18    Height:      Weight:      SpO2: 100% 100%      Intake/Output Summary (Last 24 hours) at 09/16/15 0911 Last data filed at 09/16/15 0600  Gross per 24 hour  Intake 993.75 ml   Output    250 ml  Net 743.75 ml     Filed Weights   09/15/15 1551 09/15/15 2319  Weight: 54.432 kg (120 lb) 47.7 kg (105 lb 2.6 oz)    Exam:    Constitutional:  . Appears calm and comfortable, much better today Respiratory:  . CTA bilaterally, no w/r/r.  . Respiratory effort normal. No retractions or accessory muscle use Cardiovascular:  . RRR, no m/r/g . No LE extremity edema   . Telemetry SR Abdomen:  . Abdomen o tenderness  Psychiatric:  . judgement and insight appear normal . Mental status o Mood, affect appropriate  I have personally reviewed following labs and imaging studies:  BMP unremarkable, normal AG  Scheduled Meds: . insulin aspart  0-5 Units Subcutaneous QHS  . insulin aspart  0-9 Units Subcutaneous TID WC  . insulin aspart  3 Units Subcutaneous TID WC  . insulin glargine  7 Units Subcutaneous QHS   Continuous Infusions: . sodium chloride    . insulin (NOVOLIN-R) infusion Stopped (09/16/15 0730)    Principal Problem:   DKA (diabetic ketoacidoses) (HCC) Active Problems:   Type 1 diabetes (HCC)   Vomiting   LOS: 1 day   Time spent 20 minutes

## 2015-09-16 NOTE — Progress Notes (Signed)
NUTRITION NOTE  RD consulted for nutrition education regarding diabetes.   No results found for: HGBA1C  RD provided "Carbohydrate Counting for People with Diabetes" handout from the Academy of Nutrition and Dietetics. Discussed different food groups and their effects on blood sugar, emphasizing carbohydrate-containing foods. Provided list of carbohydrates and recommended serving sizes of common foods.  Discussed importance of controlled and consistent carbohydrate intake throughout the day. Provided examples of ways to balance meals/snacks and encouraged intake of high-fiber, whole grain complex carbohydrates. Teach back method used.  Pt reports that she has had education in the past regarding nutrition and medications to control CBGs. She states that recently her pump started malfunctioning and that she was switched to insulin pens and was to switch back to the pump but was unable. She has a good knowledge of controlling her blood sugars concerning diet and states that she ensures she eats protein with each meal and that she switched from soda to fruit-infused water. Pt reports some recent difficulty due to pump malfunction and needing to contact this company and her insurance company as well as recently being post-partum.   Expect god to fair compliance.  Body mass index is 17.5 kg/(m^2). Pt meets criteria for underweight based on current BMI.  Current diet order is Carb Modified. Labs and medications reviewed. No further nutrition interventions warranted at this time. RD contact information provided. If additional nutrition issues arise, please re-consult RD.     Trenton GammonJessica Danial Sisley, MS, RD, LDN Inpatient Clinical Dietitian Pager # [phone] After hours/weekend pager # [phone]

## 2015-09-16 NOTE — Progress Notes (Signed)
Pt has 684 mth old infant in her care.  Pt notified that hospital policy prevents pts having minors in their care.  Pt verbalized understanding and states that the baby's father has an interview at 1p and will return right after and care for the interview.

## 2015-09-16 NOTE — Progress Notes (Signed)
Inpatient Diabetes Program Recommendations  AACE/ADA: New Consensus Statement on Inpatient Glycemic Control (2015)  Target Ranges:  Prepandial:   less than 140 mg/dL      Peak postprandial:   less than 180 mg/dL (1-2 hours)      Critically ill patients:  140 - 180 mg/dL   Lab Results  Component Value Date   GLUCAP 82 09/16/2015   Results for SAGAL, GAYTON (MRN [id number]) as of 09/16/2015 10:32  Ref. Range 09/16/2015 07:28  Sodium Latest Ref Range: 135-145 mmol/L 138  Potassium Latest Ref Range: 3.5-5.1 mmol/L 3.7  Chloride Latest Ref Range: 101-111 mmol/L 110  CO2 Latest Ref Range: 22-32 mmol/L 19 (L)  BUN Latest Ref Range: 6-20 mg/dL 15  Creatinine Latest Ref Range: 0.44-1.00 mg/dL 0.95  Calcium Latest Ref Range: 8.9-10.3 mg/dL 8.8 (L)  EGFR (Non-African Amer.) Latest Ref Range: >60 mL/min >60  EGFR (African American) Latest Ref Range: >60 mL/min >60  Glucose Latest Ref Range: 65-99 mg/dL 89  Anion gap Latest Ref Range: 5-15  9   Review of Glycemic Control  Diabetes history: DM1 Outpatient Diabetes medications: Lantus 27 units QHS, Novolog 1:10 CHO ratio and 1 unit for every 40 mg/dL > 100 mg/dL. Current orders for Inpatient glycemic control: Lantus 7 units QHS, Novolog sensitive tidwc and hs + 3 units tidwc  25 year old woman PMH diabetes mellitus type 1 with intermittent insulin use presented with abdominal pain, nausea or vomiting. Found to have diabetic ketoacidosis. Pt states she had been on an insulin pump, but couldn't get supplies when Medicaid changed supply company. Has not filled out paperwork nor contacted pump company or Medicaid to get new supplies. States her endo gave her 2 Lantus pens and 2 Novolog pens to use until she got back on pump. She ran out of Lantus pen and was trying to give herself large doses of Novolog, and then started feeling bad. Pt states she has not been checking her blood sugars, "I'm just tired of diabetes." Stressed importance of good glycemic  control to prevent complications. Pt states her diet has not been good, large intake of breads and grits. Questioned pt regarding Lantus dose and she states "27 units at bedtime." Denies any hypoglycemia. Reviewed hypoglycemia s/s and treatment. Also reviewed Sick Day Rules.  HgbA1C pending. On 07/26/2015, HgbA1C was 13.1% at MD office visit. Pt is breast-feeding and insulin needs would be higher with higher calorie diet. Instructed pt to check CBGs at least 3-4 times/day.  Inpatient Diabetes Program Recommendations:    Increase Lantus to 12 units QHS (approx 1/2 of home dose per pt) Increase Novolog to 4 units tidwc for meal coverage insulin.  Will follow closely. Instructed pt to f/u with endo within 1 week of discharge, take blood sugar log for MD to review, and contact Medicaid regarding new supply company for insulin pump.  Thank you. Lorenda Peck, RD, LDN, CDE Inpatient Diabetes Coordinator [phone]

## 2015-09-17 DIAGNOSIS — E1010 Type 1 diabetes mellitus with ketoacidosis without coma: Secondary | ICD-10-CM | POA: Insufficient documentation

## 2015-09-17 LAB — GLUCOSE, CAPILLARY
Glucose-Capillary: 341 mg/dL — ABNORMAL HIGH (ref 65–99)
Glucose-Capillary: 193 mg/dL — ABNORMAL HIGH (ref 65–99)

## 2015-09-17 MED ORDER — INSULIN DETEMIR 100 UNIT/ML ~~LOC~~ SOLN
20.0000 [IU] | Freq: Every day | SUBCUTANEOUS | Status: DC
  Administered 2015-09-17: 20 [IU] via SUBCUTANEOUS
  Filled 2015-09-17: qty 0.2

## 2015-09-17 MED ORDER — INSULIN ASPART 100 UNIT/ML ~~LOC~~ SOLN
12.0000 [IU] | Freq: Once | SUBCUTANEOUS | Status: AC
  Administered 2015-09-17: 12 [IU] via SUBCUTANEOUS

## 2015-09-17 MED ORDER — "INSULIN SYRINGE-NEEDLE U-100 25G X 1"" 1 ML MISC": Status: AC

## 2015-09-17 NOTE — Discharge Summary (Signed)
Sara Warren, is a 25 y.o. female  DOB [birth year]  MRN [id number].  Admission date:  09/15/2015  Admitting Physician  Standley Brooking, MD  Discharge Date:  09/17/2015   Primary MD  Izell Caled[place], MD  Recommendations for primary care physician for things to follow:   Check CBC, BMP, Magnesium and CBG log book in 3-4 days.   Admission Diagnosis  Diabetic ketoacidosis without coma associated with type 1 diabetes mellitus (HCC) [E10.10]   Discharge Diagnosis  Diabetic ketoacidosis without coma associated with type 1 diabetes mellitus (HCC) [E10.10]    Principal Problem:   DKA (diabetic ketoacidoses) (HCC) Active Problems:   Type 1 diabetes (HCC)   Vomiting   Diabetic ketoacidosis without coma associated with type 1 diabetes mellitus (HCC)      Past Medical History  Diagnosis Date  . Diabetes mellitus without complication (HCC)     History reviewed. No pertinent past surgical history.     HPI  from the history and physical done on the day of admission:   25 year old woman PMH diabetes mellitus type 1 with intermittent insulin use presented with abdominal pain, nausea or vomiting. Found to have diabetic ketoacidosis. Rapidly improved with standard treatment, now on subcutaneous insulin. Plan 24 hour further observation to assess insulin requirement. Per patient she ran out of her insulin syringes and needles and skipped a few doses putting her in DKA.   Hospital Course:     1. DKA secondary to noncompliance. Has not been taking insulin regularly for some time. Previously on pump several months ago. She told me today that she ran out of her syringes and needles and did not call her PCP to get refills, this prevented her from taking her insulin, she was initially treated with DKA protocol, DKA is completely  resolved, she is stable on subcutaneous insulin, she has been provided with prescriptions for syringes and needles and counseled on compliance. I have requested her to do every before meals at bedtime Accu-Cheks maintenance log book and show a PCP in 3-4 days  2. Abdominal pain, nausea and vomiting secondary to DKA. Resolved. Abdominal exam benign, lipase and LFTs unremarkable. 3. Diabetes mellitus type 1, uncontrolled. Plan as in #1 above. 4. Hypokalemia. Replaced. Request PCP to check BMP and magnesium next visit.    Follow UP  Follow-up Information    Follow up with [facility]EMERGENCY DEPT.   Specialty:  Emergency Medicine   Why:  As needed, If symptoms worsen   Contact information:   [id number] [place] [address] [phone]      Follow up with Izell , MD In 3 days.   Specialty:  Endocrinology   Why:  Appointment scheduled for Monday, September 19, 2015 at 1:40 p.m. to follow-up DKA / DM.   Contact information:   [address] [place] [address] [phone]        Consults obtained - None  Discharge Condition: Fair  Diet and Activity recommendation: See Discharge Instructions below  Discharge Instructions       Discharge Instructions    Diet - low sodium heart healthy    Complete by:  As directed      Discharge instructions    Complete by:  As directed   Follow with Primary MD PATEL,DHAVAL, MD in 3 days   Get CBC, CMP, 2 view Chest X ray checked  by Primary MD next visit.    Activity: As tolerated with Full fall precautions use walker/cane & assistance as needed   Disposition Home    Diet:   Heart Healthy Low Carb.  Accuchecks 4 times/day, Once in AM empty stomach and then before each meal. Log in all results and show them to your Prim.MD in 3 days. If any glucose reading is under 80 or above 300 call your Prim MD immidiately. Follow Low glucose instructions for glucose  under 80 as instructed.   For Heart failure patients - Check your Weight same time everyday, if you gain over 2 pounds, or you develop in leg swelling, experience more shortness of breath or chest pain, call your Primary MD immediately. Follow Cardiac Low Salt Diet and 1.5 lit/day fluid restriction.   On your next visit with your primary care physician please Get Medicines reviewed and adjusted.   Please request your Prim.MD to go over all Hospital Tests and Procedure/Radiological results at the follow up, please get all Hospital records sent to your Prim MD by signing hospital release before you go home.   If you experience worsening of your admission symptoms, develop shortness of breath, life threatening emergency, suicidal or homicidal thoughts you must seek medical attention immediately by calling 911 or calling your MD immediately  if symptoms less severe.  You Must read complete instructions/literature along with all the possible adverse reactions/side effects for all the Medicines you take and that have been prescribed to you. Take any new Medicines after you have completely understood and accpet all the possible adverse reactions/side effects.   Do not drive, operate heavy machinery, perform activities at heights, swimming or participation in water activities or provide baby sitting services if your were admitted for syncope or siezures until you have seen by Primary MD or a Neurologist and advised to do so again.  Do not drive when taking Pain medications.    Do not take more than prescribed Pain, Sleep and Anxiety Medications  Special Instructions: If you have smoked or chewed Tobacco  in the last 2 yrs please stop smoking, stop any regular Alcohol  and or any Recreational drug use.  Wear Seat belts while driving.   Please note  You were cared for by a hospitalist during your hospital stay. If you have any questions about your discharge medications or the care you received  while you were in the hospital after you are discharged, you can call the unit and asked to speak with the hospitalist on call if the hospitalist that took care of you is not available. Once you are discharged, your primary care physician will handle any further medical issues. Please note that NO REFILLS for any discharge medications will be authorized once you are discharged, as it is imperative that you return to your primary care physician (or establish a relationship with a primary care physician if you do not have one) for your aftercare needs so that they can reassess your need for medications and monitor your lab values.     Increase activity slowly    Complete by:  As directed  Discharge Medications       Medication List    TAKE these medications        GLUCAGON EMERGENCY 1 MG injection  Generic drug:  glucagon  Inject as directed as directed.     insulin aspart 100 unit/mL injection  Commonly known as:  novoLOG  Inject 1-25 Units into the skin 3 (three) times daily before meals. Take one unit per 10 grams carbs     insulin detemir 100 UNIT/ML injection  Commonly known as:  LEVEMIR  Inject 27 Units into the skin at bedtime. In addition to sliding scale     Insulin Syringe-Needle U-100 25G X 1" 1 ML Misc  For 4 times a day insulin SQ, 1 month supply. Diagnosis E11.65  Any brand that is covered.        Major procedures and Radiology Reports - PLEASE review detailed and final reports for all details, in brief -       No results found.  Micro Results      Recent Results (from the past 240 hour(s))  MRSA PCR Screening     Status: None   Collection Time: 09/15/15 11:31 PM  Result Value Ref Range Status   MRSA by PCR NEGATIVE NEGATIVE Final    Comment:        The GeneXpert MRSA Assay (FDA approved for NASAL specimens only), is one component of a comprehensive MRSA colonization surveillance program. It is not intended to diagnose MRSA infection  nor to guide or monitor treatment for MRSA infections.        Today   Subjective    Sara Warren today has no headache,no chest abdominal pain,no new weakness tingling or numbness, feels much better wants to go home today.     Objective   Blood pressure 108/68, pulse 65, temperature 98.3 F (36.8 C), temperature source Oral, resp. rate 16, height 5\' 5"  (1.651 m), weight 47.7 kg (105 lb 2.6 oz), last menstrual period 09/15/2015, SpO2 100 %, unknown if currently breastfeeding.   Intake/Output Summary (Last 24 hours) at 09/17/15 1030 Last data filed at 09/17/15 0800  Gross per 24 hour  Intake    480 ml  Output      0 ml  Net    480 ml    Exam Awake Alert, Oriented x 3, No new F.N deficits, Normal affect [place].AT,PERRAL Supple Neck,No JVD, No cervical lymphadenopathy appriciated.  Symmetrical Chest wall movement, Good air movement bilaterally, CTAB RRR,No Gallops,Rubs or new Murmurs, No Parasternal Heave +ve B.Sounds, Abd Soft, Non tender, No organomegaly appriciated, No rebound -guarding or rigidity. No Cyanosis, Clubbing or edema, No new Rash or bruise   Data Review   CBC w Diff: Lab Results  Component Value Date   WBC 17.1* 09/15/2015   WBC 9.5 01/12/2014   HGB 13.1 09/15/2015   HGB 13.7 01/12/2014   HCT 37.9 09/15/2015   HCT 44.0 01/12/2014   PLT 349 09/15/2015   LYMPHOPCT 31 02/14/2014   MONOPCT 5 02/14/2014   EOSPCT 0 02/14/2014   BASOPCT 0 02/14/2014    CMP: Lab Results  Component Value Date   NA 138 09/16/2015   K 3.7 09/16/2015   CL 110 09/16/2015   CO2 19* 09/16/2015   BUN 15 09/16/2015   CREATININE 0.95 09/16/2015   PROT 9.5* 09/15/2015   ALBUMIN 4.9 09/15/2015   BILITOT 1.4* 09/15/2015   ALKPHOS 112 09/15/2015   AST 59* 09/15/2015   ALT 43 09/15/2015  .   Total  Time in preparing paper work, data evaluation and todays exam - 35 minutes  Leroy Sea M.D on 09/17/2015 at 10:30 AM  [facility]   Office  [phone]

## 2015-09-17 NOTE — Progress Notes (Signed)
Patient discharged.  Leaving with one prescription and personal belongings.  Accompanied by boyfriend and baby.  Denies pain.  Room air.  No s/s of distress.  Reports understanding of discharge instructions.  No complaints.

## 2015-09-17 NOTE — Discharge Instructions (Signed)
Follow with Primary MD PATEL,DHAVAL, MD in 3 days   Get CBC, CMP, 2 view Chest X ray checked  by Primary MD next visit.    Activity: As tolerated with Full fall precautions use walker/cane & assistance as needed   Disposition Home    Diet:   Heart Healthy Low Carb.  Accuchecks 4 times/day, Once in AM empty stomach and then before each meal. Log in all results and show them to your Prim.MD in 3 days. If any glucose reading is under 80 or above 300 call your Prim MD immidiately. Follow Low glucose instructions for glucose under 80 as instructed.   For Heart failure patients - Check your Weight same time everyday, if you gain over 2 pounds, or you develop in leg swelling, experience more shortness of breath or chest pain, call your Primary MD immediately. Follow Cardiac Low Salt Diet and 1.5 lit/day fluid restriction.   On your next visit with your primary care physician please Get Medicines reviewed and adjusted.   Please request your Prim.MD to go over all Hospital Tests and Procedure/Radiological results at the follow up, please get all Hospital records sent to your Prim MD by signing hospital release before you go home.   If you experience worsening of your admission symptoms, develop shortness of breath, life threatening emergency, suicidal or homicidal thoughts you must seek medical attention immediately by calling 911 or calling your MD immediately  if symptoms less severe.  You Must read complete instructions/literature along with all the possible adverse reactions/side effects for all the Medicines you take and that have been prescribed to you. Take any new Medicines after you have completely understood and accpet all the possible adverse reactions/side effects.   Do not drive, operate heavy machinery, perform activities at heights, swimming or participation in water activities or provide baby sitting services if your were admitted for syncope or siezures until you have seen by  Primary MD or a Neurologist and advised to do so again.  Do not drive when taking Pain medications.    Do not take more than prescribed Pain, Sleep and Anxiety Medications  Special Instructions: If you have smoked or chewed Tobacco  in the last 2 yrs please stop smoking, stop any regular Alcohol  and or any Recreational drug use.  Wear Seat belts while driving.   Please note  You were cared for by a hospitalist during your hospital stay. If you have any questions about your discharge medications or the care you received while you were in the hospital after you are discharged, you can call the unit and asked to speak with the hospitalist on call if the hospitalist that took care of you is not available. Once you are discharged, your primary care physician will handle any further medical issues. Please note that NO REFILLS for any discharge medications will be authorized once you are discharged, as it is imperative that you return to your primary care physician (or establish a relationship with a primary care physician if you do not have one) for your aftercare needs so that they can reassess your need for medications and monitor your lab values.

## 2015-09-17 NOTE — Care Management Note (Signed)
Case Management Note  Patient Details  Name: Sara Warren How MRN: [id number] Date of Birth: 02/28/1991  Subjective/Objective:      DKA              Action/Plan: Discharge Planning: AVS reviewed:   NCM spoke to pt and she has follow up appt with Endocrinologist, Dr Allena KatzPatel on Monday and she will discuss with him receiving insulin pens. States she does not think she wants to go back to using insulin pump. Pt has Medicaid and it does cover her medication with $3 copay. Has insulin at home.   Expected Discharge Date:  09/17/2015               Expected Discharge Plan:  Home/Self Care  In-House Referral:  NA  Discharge planning Services  CM Consult  Post Acute Care Choice:  NA Choice offered to:  NA  DME Arranged:  N/A DME Agency:  NA  HH Arranged:  NA HH Agency:  NA  Status of Service:  Completed, signed off  If discussed at Long Length of Stay Meetings, dates discussed:    Additional Comments:  Sara Warren, Sara Warren Ellen, RN 09/17/2015, 11:32 AM

## 2015-09-19 LAB — HEMOGLOBIN A1C
Hgb A1c MFr Bld: 12.4 % — ABNORMAL HIGH (ref 4.8–5.6)
Mean Plasma Glucose: 309 mg/dL

## 2016-02-23 IMAGING — US US MFM OB TRANSVAGINAL
1 series · 15 of 25 positions shown · non-contrast
Comparison: none

[Series 1: us mfm ob transvaginal · 15 of 25 slices shown]
[im 1/25]
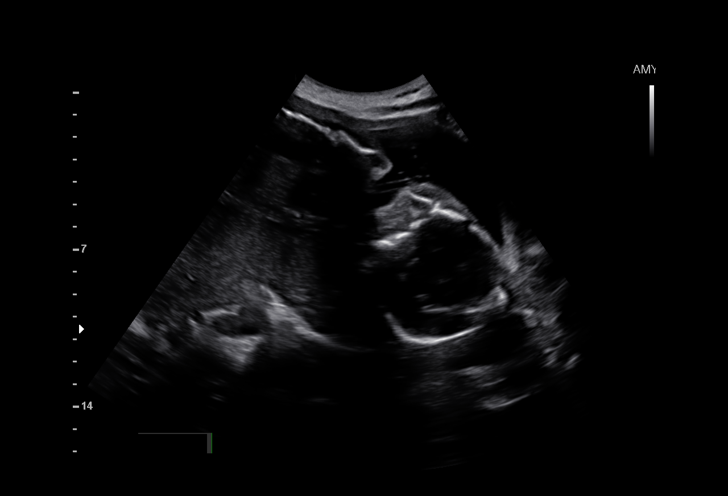
[im 3/25]
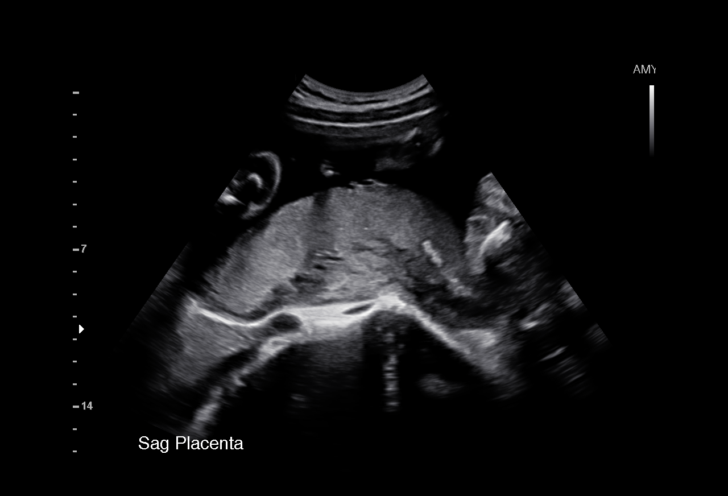
[im 5/25]
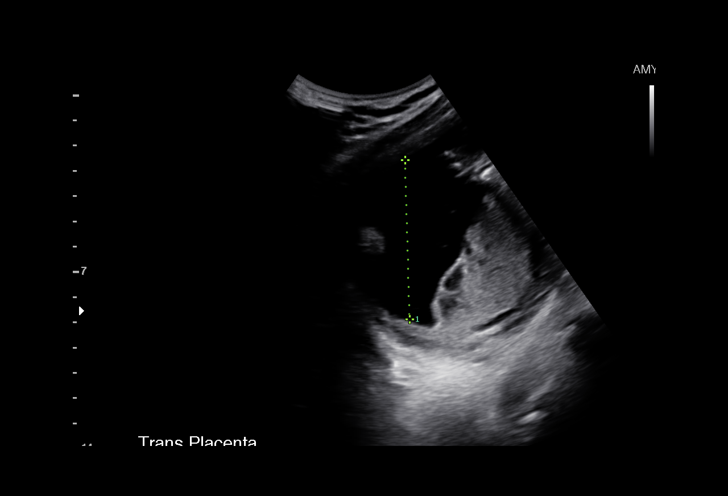
[im 6/25]
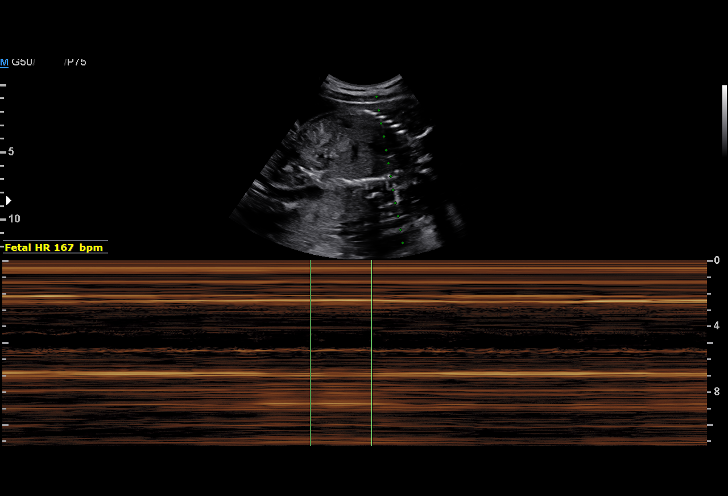
[im 8/25]
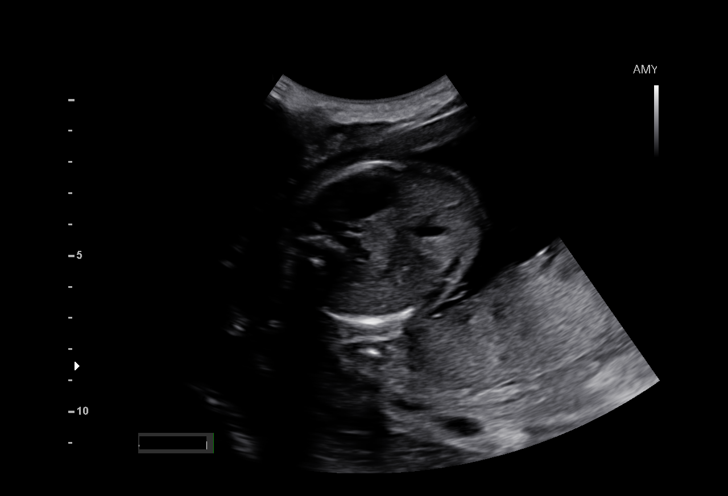
[im 10/25]
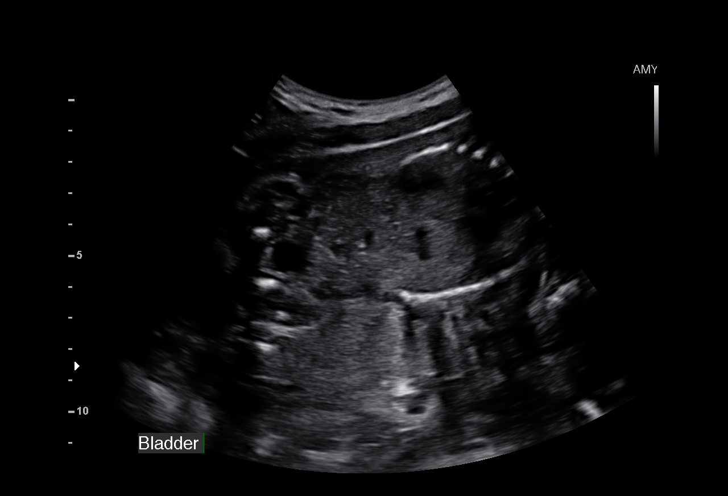
[im 11/25]
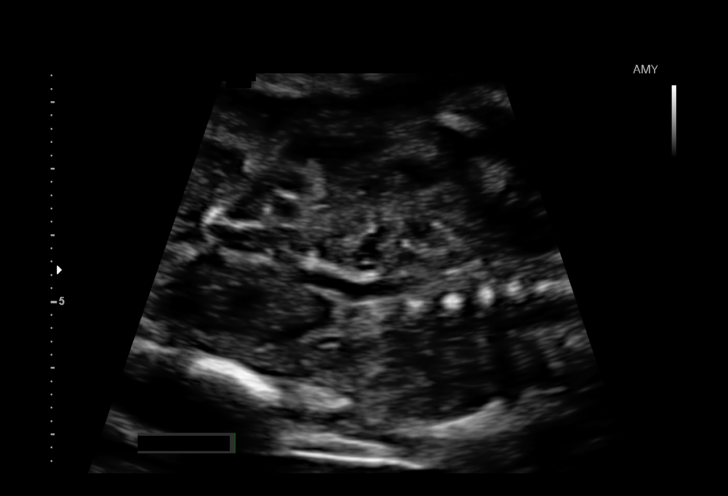
[im 13/25]
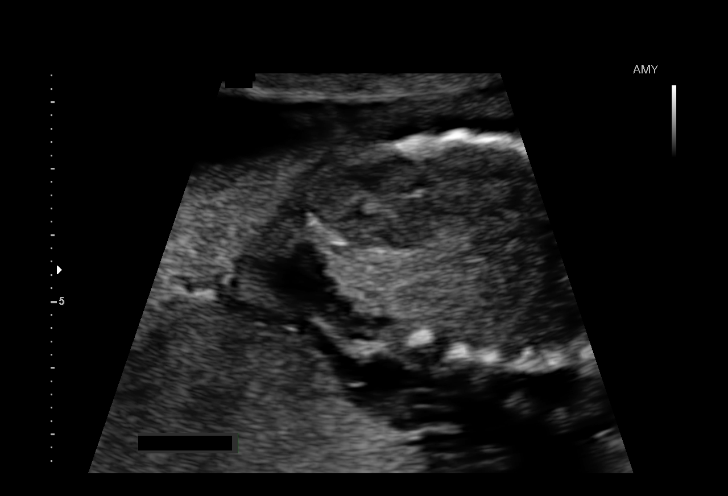
[im 15/25]
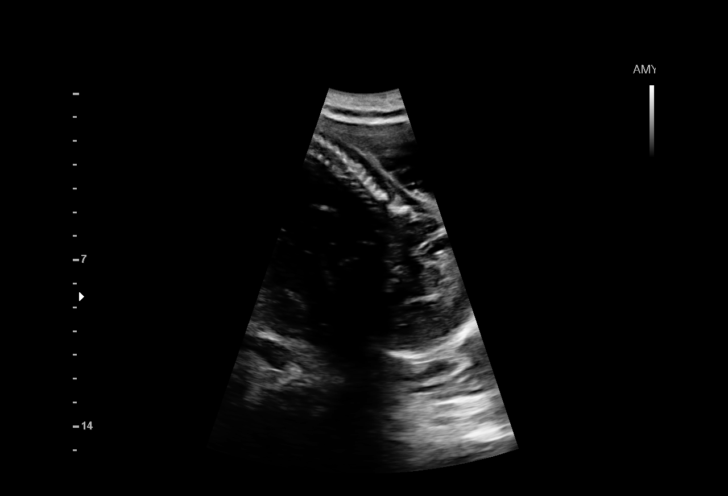
[im 16/25]
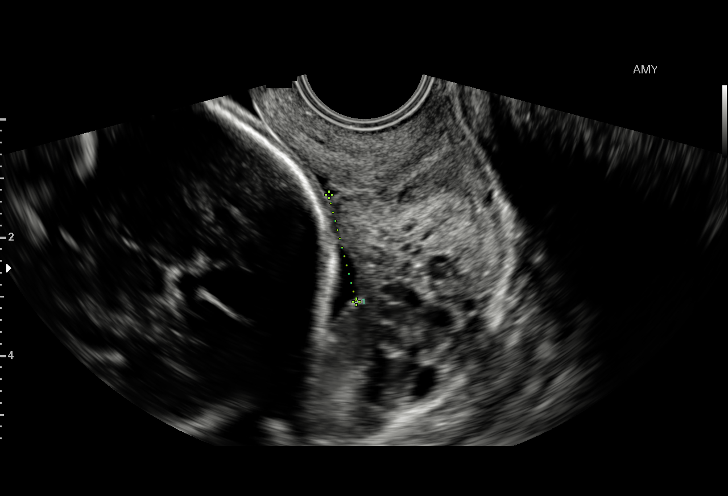
[im 18/25]
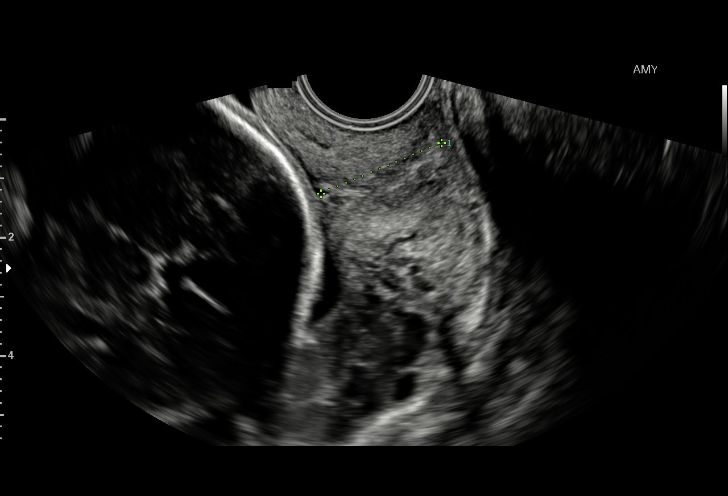
[im 20/25]
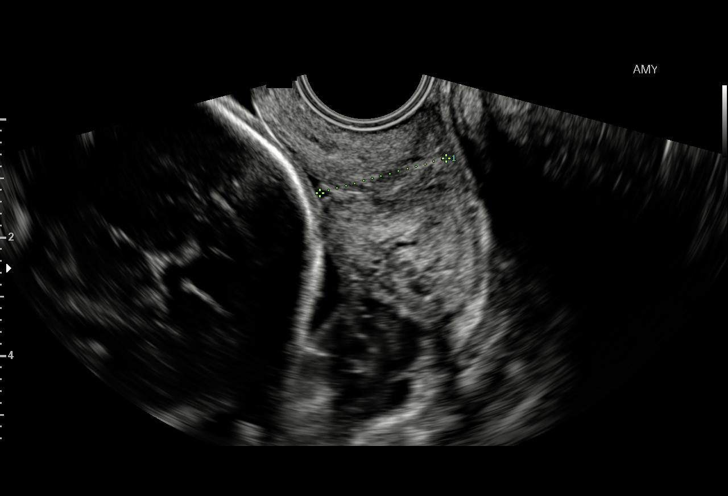
[im 21/25]
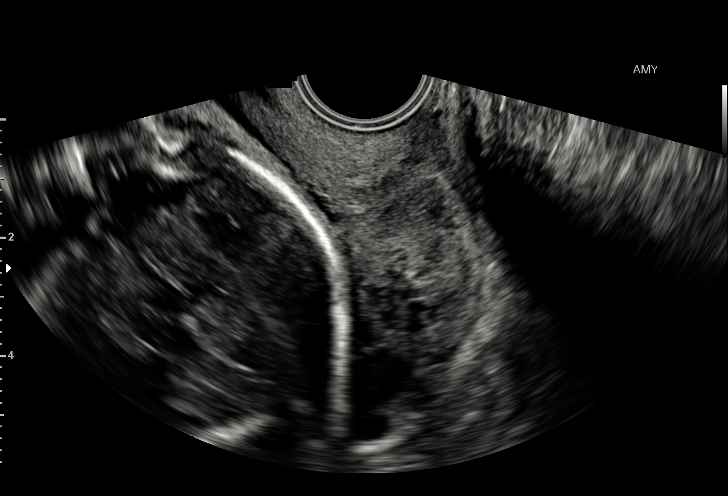
[im 23/25]
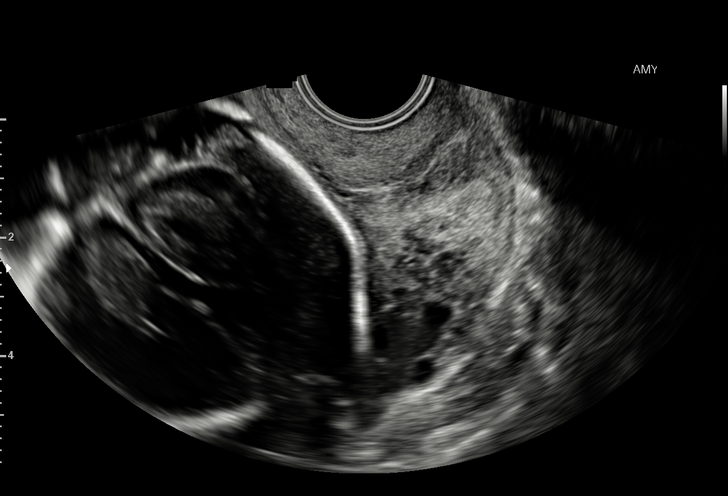
[im 25/25]
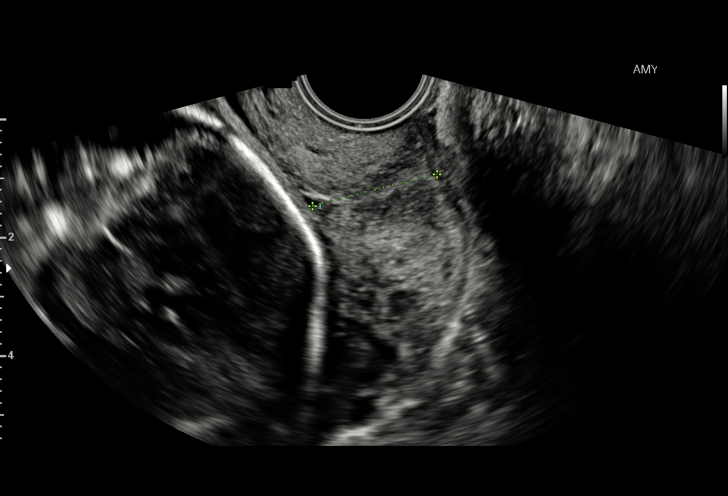

[15 of 25 positions shown; findings below may reference images not displayed]

OBSTETRICS REPORT
(Signed Final 01/26/2015 [DATE])

Date:

Service(s) Provided

US MFM OB TRANSVAGINAL                                 76817.2
Indications

Pre-existing diabetes, type 1, in pregnancy,
second trimester on pump (Dr. Nivirus)
Low lying placenta
24 weeks gestation of pregnancy
Cervical shortening, second trimester
Fetal Evaluation

Num Of             1
Fetuses:
Fetal Heart        167                          bpm
Rate:
Cardiac Activity:  Observed
Presentation:      Cephalic
Placenta:          Posterior, low-lying,
1.8 cm from int os
P. Cord            Previously Visualized
Insertion:

Amniotic Fluid
AFI FV:      Subjectively within normal limits
Larg Pckt:    6.30   cm
Gestational Age

LMP:           24w 2d        Date:  08/09/14                  EDD:   05/16/15
Best:          24w 2d    Det. By:   LMP  (08/09/14)           EDD:   05/16/15
Cervix Uterus Adnexa

Cervical Length:    2.2       cm

Cervix:       Measured transvaginally.
Impression

SIUP at 24+2 weeks
Normal amniotic fluid volume
EV views of cervix: CL = 2.2 cms (no change in 2 weeks);
inferior edge ends 
 1.8 cms from internal os i.e. low-lying
placenta previa
Recommendations

Follow-up ultrasound for growth and CL in 2 weeks

## 2016-04-10 IMAGING — US US MFM OB FOLLOW-UP
1 series · 14 of 28 positions shown · non-contrast
Comparison: none

[Series 1: us mfm ob follow-up · 38 acquisitions, 14 frames shown]
[im 2/38]
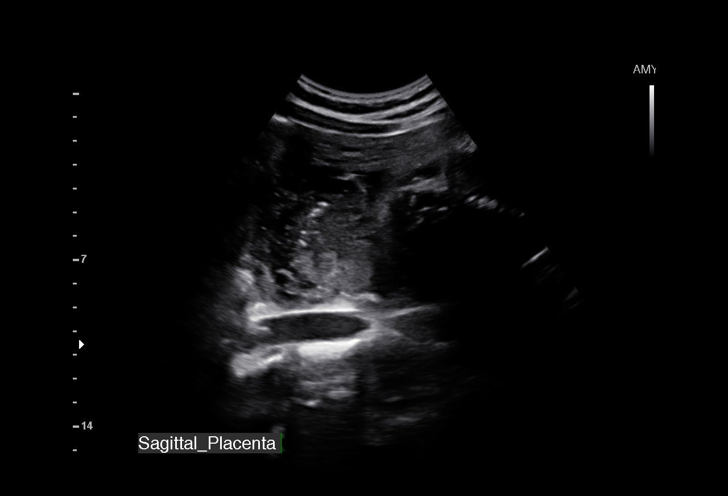
[im 5/38]
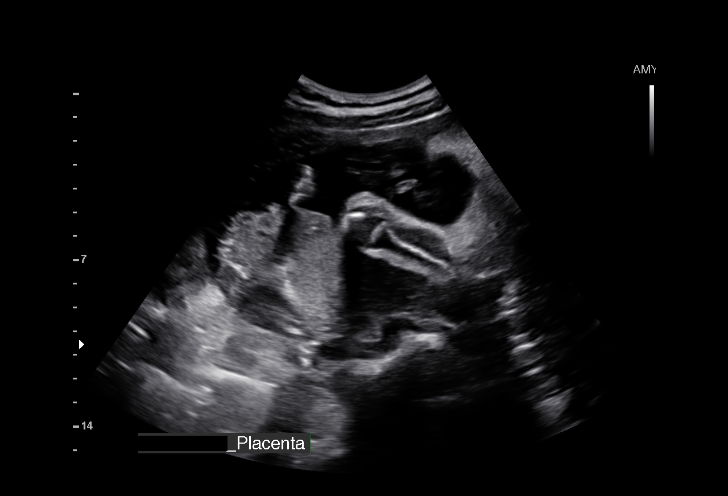
[im 7/38]
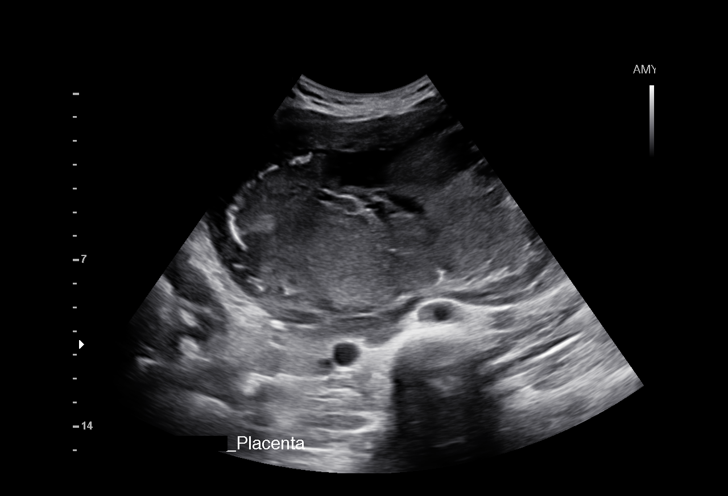
[im 10/38]
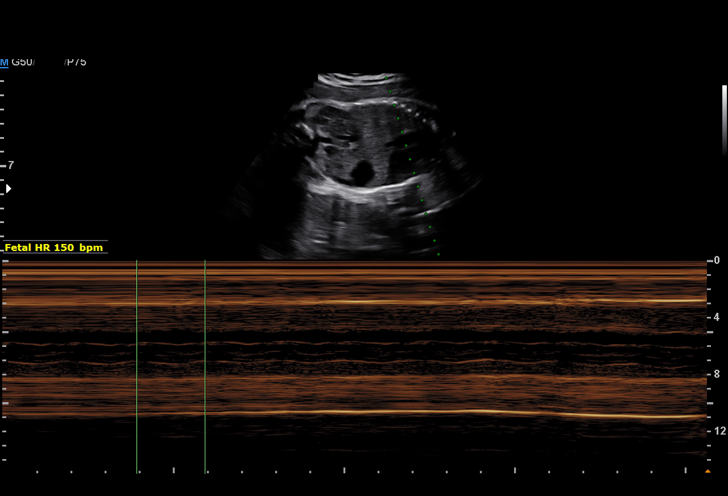
[im 13/38]
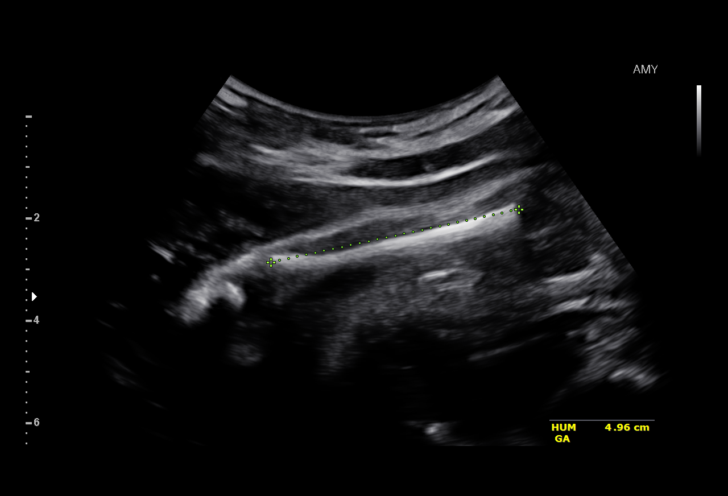
[im 16/38]
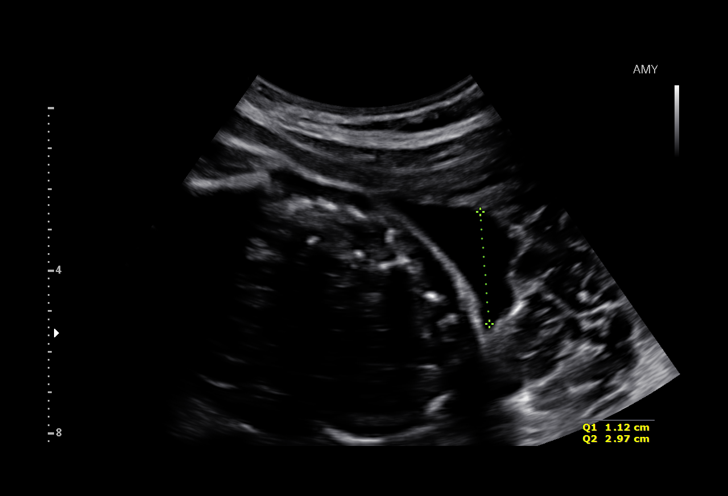
[im 18/38]
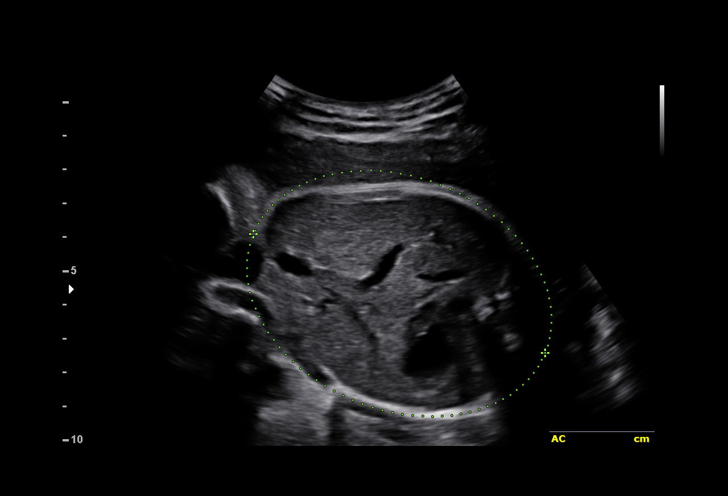
[im 21/38]
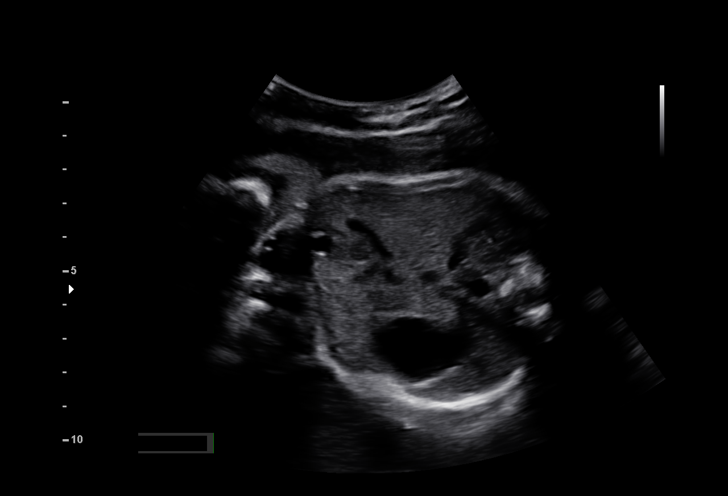
[im 24/38]
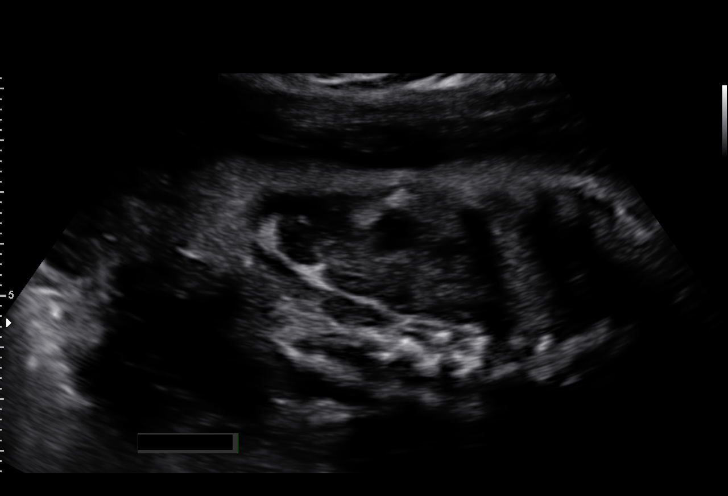
[im 27/38]
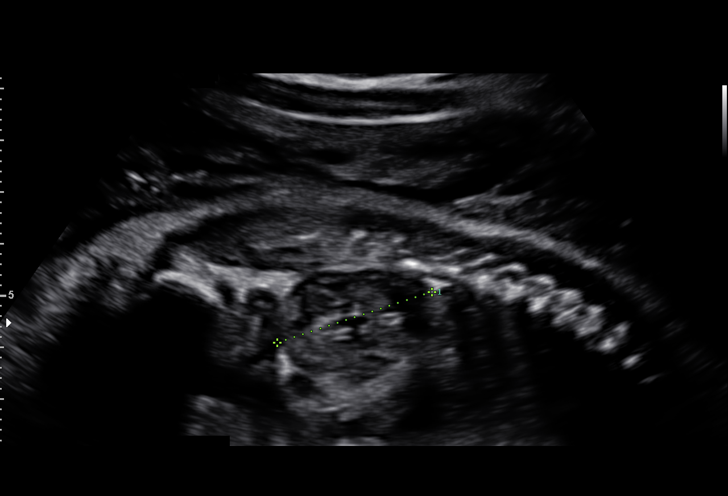
[im 29/38]
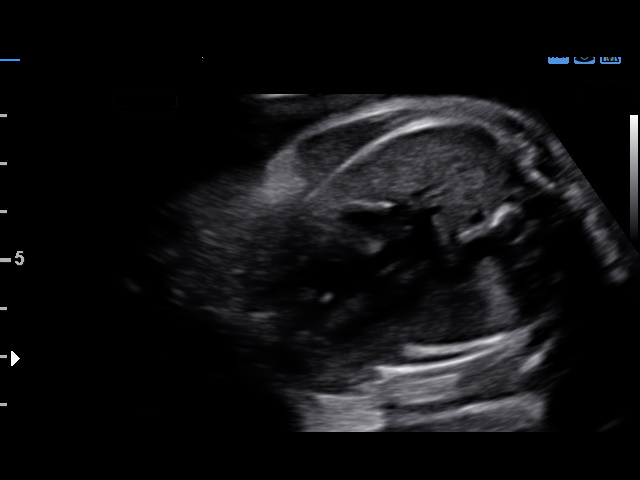
[im 32/38]
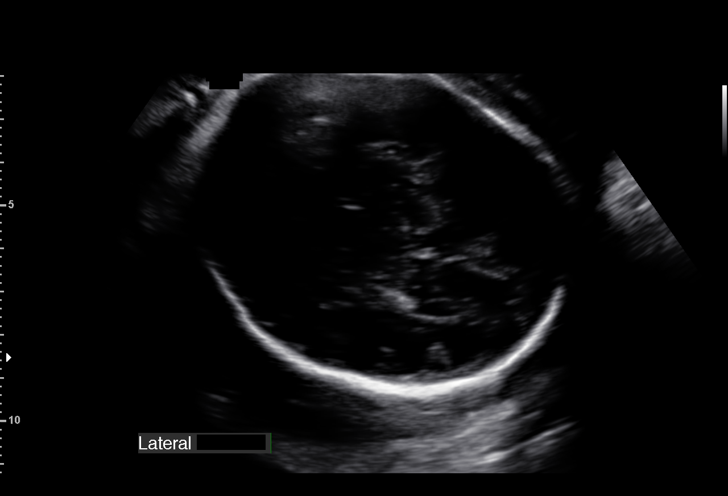
[im 35/38]
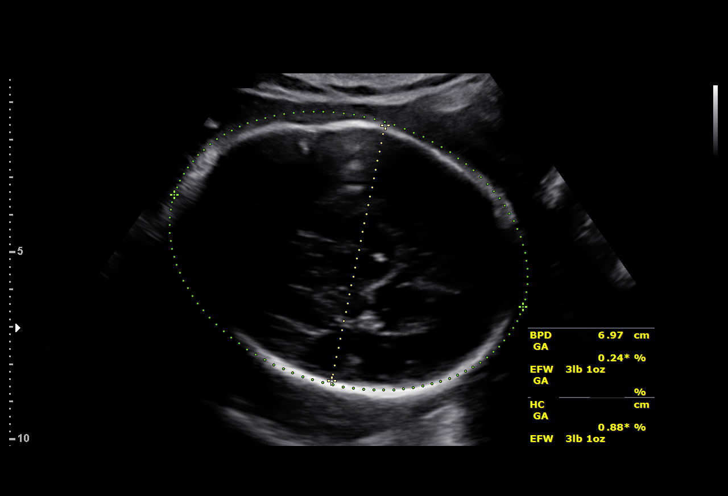
[im 38/38]
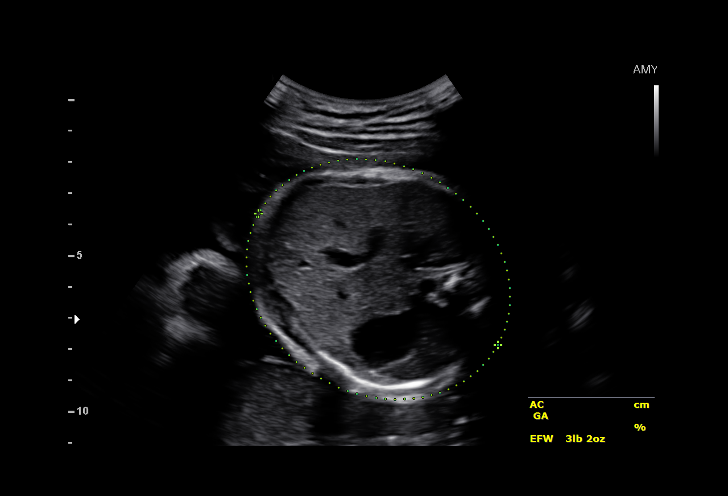

[14 of 28 positions shown; findings below may reference images not displayed]

am)

Date:

RDMS                                     d ste392

1  VISENT LUBANA            [id number]       [id number]     [id number]
Indications

Pre-existing diabetes, type 1, in
pregnancy, third trimester
31 weeks gestation of pregnancy
Low lying placenta, antepartum-resolved
Cervical shortening, third trimester
OB History

Gravidity:     2         SAB:   1
Fetal Evaluation

Num Of Fetuses:      1
Fetal Heart          150
Rate(bpm):
Cardiac Activity:    Observed
Presentation:        Cephalic
Placenta:            Posterior, above cervical os
P. Cord Insertion:   Previously Visualized

Amniotic Fluid
AFI FV:      Subjectively low-normal
AFI Sum:     7.69     cm     < 3  %Tile     Larg Pckt:    2.97   cm
RUQ:   1.12    cm    RLQ:   0.84    cm   LUQ:    2.97    cm   LLQ:    2.76   cm
Biometry

BPD:      69.9  mm     G. Age:   28w 0d                  CI:        69.52   %    70 - 86
FL/HC:      21.2   %    19.3 -
HC:      267.6  mm     G. Age:   29w 1d       < 3   %    HC/AC:      1.05        0.96 -
AC:      255.3  mm     G. Age:   29w 5d        14   %    FL/BPD      81.3   %    71 - 87
:
FL:       56.8  mm     G. Age:   29w 6d        10   %    FL/AC:      22.2   %    20 - 24
HUM:      49.6  mm     G. Age:   29w 1d        12   %

Est.        9190   gm    3 lb 2 oz      26   %
FW:
Gestational Age

LMP:           31w 0d        Date:  08/09/14                  EDD:   05/16/15
U/S Today:     29w 1d                                         EDD:   05/29/15
Best:          31w 0d    Det. By:   LMP  (08/09/14)           EDD:   05/16/15
Anatomy

Cranium:          Previously seen        Aortic Arch:       Previously seen
Fetal Cavum:      Previously seen        Ductal Arch:       Previously seen
Ventricles:       Previously seen        Diaphragm:         Appears normal
Choroid Plexus:   Previously seen        Stomach:           Appears normal,
left sided
Cerebellum:       Previously seen        Abdomen:           Previously seen
Posterior         Previously seen        Abdominal          Previously seen
Fossa:                                   Wall:
Nuchal Fold:      Not applicable (>20    Cord Vessels:      Previously seen
wks GA)
Face:             Orbits and profile     Kidneys:           Appear normal
previously seen
Lips:             Previously seen        Bladder:           Appears normal
Heart:            Appears normal         Spine:             Previously seen
(4CH, axis, and
situs)
RVOT:             Previously seen        Upper              Previously seen
Extremities:
LVOT:             Previously seen        Lower              Previously seen
Extremities:

Other:   Parents do not wish to know sex of fetus. Left heel and 5th digit
previously visualized.
Impression

Single IUP at 31w 0d
Type 1 diabetes
The estimated fetal weight today is at the 26th %tile.
Posterior placenta without previa
Low normal amniotic fluid volume (AFI 7.7 cm)
Recommendations

Recommend starting antenatal tesing next week- 2x
weekly NST with weekly evaluation of amniotic fluid volume
Ultrasound for interval growth in 4 weeks

## 2016-05-03 ENCOUNTER — Ambulatory Visit: Payer: Medicaid Other | Attending: Family Medicine | Admitting: Family Medicine

## 2016-05-03 ENCOUNTER — Encounter: Payer: Self-pay | Admitting: Family Medicine

## 2016-05-03 VITALS — BP 103/71 | HR 97 | Temp 98.4°F | Resp 18 | Ht 66.5 in | Wt 118.8 lb

## 2016-05-03 DIAGNOSIS — Z794 Long term (current) use of insulin: Secondary | ICD-10-CM | POA: Insufficient documentation

## 2016-05-03 DIAGNOSIS — E109 Type 1 diabetes mellitus without complications: Secondary | ICD-10-CM | POA: Diagnosis not present

## 2016-05-03 DIAGNOSIS — E119 Type 2 diabetes mellitus without complications: Secondary | ICD-10-CM | POA: Diagnosis not present

## 2016-05-03 DIAGNOSIS — E108 Type 1 diabetes mellitus with unspecified complications: Secondary | ICD-10-CM | POA: Diagnosis not present

## 2016-05-03 LAB — BASIC METABOLIC PANEL WITH GFR
BUN: 11 mg/dL (ref 7–25)
CHLORIDE: 104 mmol/L (ref 98–110)
CO2: 22 mmol/L (ref 20–31)
Calcium: 9.5 mg/dL (ref 8.6–10.2)
Creat: 0.75 mg/dL (ref 0.50–1.10)
GFR, Est African American: >89 mL/min (ref >=60)
GFR, Est Non African American: >89 mL/min (ref >=60)
Glucose, Bld: 179 mg/dL — ABNORMAL HIGH (ref 65–99)
POTASSIUM: 4.3 mmol/L (ref 3.5–5.3)
SODIUM: 137 mmol/L (ref 135–146)

## 2016-05-03 LAB — LIPID PANEL
CHOL/HDL RATIO: 2.6 ratio (ref <5.0)
Cholesterol: 188 mg/dL (ref <200)
HDL: 71 mg/dL (ref >50)
LDL Cholesterol: 101 mg/dL — ABNORMAL HIGH (ref <100)
TRIGLYCERIDES: 80 mg/dL (ref <150)
VLDL: 16 mg/dL (ref <30)

## 2016-05-03 LAB — GLUCOSE, POCT (MANUAL RESULT ENTRY): POC Glucose: 147 mg/dl — AB (ref 70–99)

## 2016-05-03 LAB — POCT GLYCOSYLATED HEMOGLOBIN (HGB A1C): HEMOGLOBIN A1C: 12.5

## 2016-05-03 MED ORDER — ACCU-CHEK SOFTCLIX LANCET DEV KIT: PACK | 0 refills | Status: AC

## 2016-05-03 MED ORDER — GLUCOSE BLOOD VI STRP: ORAL_STRIP | 12 refills | Status: AC

## 2016-05-03 MED ORDER — ACCU-CHEK AVIVA PLUS W/DEVICE KIT: PACK | 0 refills | Status: AC

## 2016-05-03 MED ORDER — INSULIN DETEMIR 100 UNIT/ML ~~LOC~~ SOLN: SUBCUTANEOUS | 2 refills | Status: AC

## 2016-05-03 MED ORDER — ACCU-CHEK SOFTCLIX LANCETS MISC: 12 refills | Status: AC

## 2016-05-03 NOTE — Progress Notes (Signed)
Patient is here to establish care.  Patient denies pain at this time.  Patient has not taken medication today. Patient has cookies this morning.  Patient declined the flu vaccine today.  Patient would like to discuss the dangers and effects of having DM.

## 2016-05-03 NOTE — Progress Notes (Signed)
Subjective:  Patient ID: Sara Warren, female    DOB: 1990-06-24  Age: 26 y.o. MRN: [id number]  CC: Establish Care   HPI Kiana Amberg presents To establish care for diabetes. She has a past medical history of type 1 diabetes diagnosed at age 10. Reports history of insulin pump use. First insulin pump received in 2009. Reports discontinuing use of insulin pump early 2017 due to malfunctioning pump. She reports being inconsistent with logging her blood sugars. She reports high blood glucose readings at home. Reports blood glucose yesterday was 500. She reports currently taking Levemir 30 units at bedtime and NovoLog sliding scale meal insulin coverage. She does not have her sliding scale information with her. She reports being seen by her endocrinologist 6 months ago. She denies any blurry vision or scant wounds. She does report polyuria, polyphagia , and postprandial fullness with nausea when blood sugars are increased.   Outpatient Medications Prior to Visit  Medication Sig Dispense Refill  . insulin aspart (NOVOLOG) 100 unit/mL injection Inject 1-25 Units into the skin 3 (three) times daily before meals. Take one unit per 10 grams carbs    . Insulin Syringe-Needle U-100 25G X 1" 1 ML MISC For 4 times a day insulin SQ, 1 month supply. Diagnosis E11.65  Any brand that is covered. 30 each 0  . insulin detemir (LEVEMIR) 100 UNIT/ML injection Inject 27 Units into the skin at bedtime. In addition to sliding scale    . glucagon (GLUCAGON EMERGENCY) 1 MG injection Inject as directed as directed.     No facility-administered medications prior to visit.     ROS Review of Systems  Eyes: Negative.   Respiratory: Negative.   Cardiovascular: Negative.   Gastrointestinal: Positive for abdominal pain, nausea and vomiting.       Symptoms with increased blood sugars.  Endocrine: Positive for polydipsia and polyuria.       Symptoms with increased blood sugars.    Objective:  BP 103/71 (BP Location:  Left Arm, Patient Position: Sitting, Cuff Size: Normal)   Pulse 97   Temp 98.4 F (36.9 C) (Oral)   Resp 18   Ht 5' 6.5" (1.689 m)   Wt 118 lb 12.8 oz (53.9 kg)   LMP 05/03/2016   SpO2 100%   BMI 18.89 kg/m   BP/Weight 05/03/2016 09/17/2015 3/47/4259  Systolic BP 563 875 -  Diastolic BP 71 68 -  Wt. (Lbs) 118.8 - 105.16  BMI 18.89 - 17.5    Physical Exam  Eyes: Conjunctivae are normal. Pupils are equal, round, and reactive to light.  Cardiovascular: Normal rate, regular rhythm, normal heart sounds and intact distal pulses.   Pulmonary/Chest: Effort normal and breath sounds normal.  Abdominal: Soft. Bowel sounds are normal.  Skin: Skin is warm and dry.  Nursing note and vitals reviewed.   Diabetic Foot Exam - Simple   Simple Foot Form Diabetic Foot exam was performed with the following findings:  Yes 05/03/2016  9:20 AM  Visual Inspection No deformities, no ulcerations, no other skin breakdown bilaterally:  Yes Sensation Testing Intact to touch and monofilament testing bilaterally:  Yes Pulse Check Posterior Tibialis and Dorsalis pulse intact bilaterally:  Yes Comments      Assessment & Plan:   Problem List Items Addressed This Visit      Endocrine   Type 1 diabetes ([place]) - Primary   -Clinical pharmacist consulted concerning current medication regimen.    Relevant Medications   insulin detemir (LEVEMIR) 100 UNIT/ML injection  Other Relevant Orders   BASIC METABOLIC PANEL WITH GFR (Completed)   Microalbumin/Creatinine Ratio, Urine (Completed)   Lipid Panel (Completed)   Glucose (CBG) (Completed)   HgB A1c (Completed)   Ambulatory referral to Ophthalmology   Ambulatory referral to Endocrinology   Diabetes mellitus without complication (HCC)   Relevant Medications   insulin detemir (LEVEMIR) 100 UNIT/ML injection      Meds ordered this encounter  Medications  . insulin detemir (LEVEMIR) 100 UNIT/ML injection    Sig: Use 17 units in the morning and 15 units  at night.    Dispense:  10 mL    Refill:  2  . glucose blood (ACCU-CHEK AVIVA PLUS) test strip    Sig: Use as instructed    Dispense:  100 each    Refill:  12  . Blood Glucose Monitoring Suppl (ACCU-CHEK AVIVA PLUS) w/Device KIT    Sig: Use as directed    Dispense:  1 kit    Refill:  0  . ACCU-CHEK SOFTCLIX LANCETS lancets    Sig: Use as instructed    Dispense:  100 each    Refill:  12  . Lancets Misc. (ACCU-CHEK SOFTCLIX LANCET DEV) KIT    Sig: Use as directed    Dispense:  1 kit    Refill:  0    Follow-up: Return in about 3 months (around 07/31/2016), or if symptoms worsen or fail to improve, for Diabetes .   Mandesia R Hairston FNP    

## 2016-05-03 NOTE — Progress Notes (Signed)
Patient with uncontrolled type 1 diabetes, consulted for insulin adjustment.  Patient reports that she has been very uncontrolled recently.  Patient on Levemir 30 units daily and Novolog sliding scale (typically ends up taking about 20 units or more before meals.)  Levemir is not a long-acting insulin until it is at much higher doses. Patient confirms this by saying that she feels like it wears off in the afternoon and that is when her blood sugars start to increase  Will split Levemir to 17 units in the morning and 15 units at night. Patient would like to switch to Lantus for once daily injection but will finish off her supply of Levemir. Plan to switch at next visit.  Ordered new Accu-chek meter and supplies as this is what is covered by Medicaid.  Patient to return to see me in 1 week for insulin adjustment.

## 2016-05-03 NOTE — Patient Instructions (Addendum)
Thanks for coming to see [place]!  Change your Levemir to 17 units in the morning and 15 units in the evening. We will plan to order the Lantus at the next visit  Come back and see Erline Levine in 1 week   Type 1 Diabetes Mellitus, Self Care, Adult When you have type 1 diabetes (type 1 diabetes mellitus), you must keep your blood sugar (glucose) under control. You can do this with:  Insulin.  Nutrition.  Exercise.  Lifestyle changes.  Other medicines, if needed.  Support from your doctors and others. How do I manage my blood sugar?  Check your blood sugar every day, as often as told.  Call your doctor if your blood sugar is above your goal numbers for 2 tests in a row.  Have your A1c (hemoglobin A1c) level checked at least twice a year. Have it checked more often if your doctor tells you to. Your doctor will set treatment goals for you. Generally, you should have these blood sugar levels:  Before meals (preprandial): 80-130 mg/dL (4.4-7.2 mmol/L).  After meals (postprandial): below 180 mg/dL (10 mmol/L).  A1c level: less than 7%. What do I need to know about high blood sugar? High blood sugar is called hyperglycemia. Know the signs of high blood sugar. Signs may include:  Feeling:  Thirsty.  Hungry.  Very tired.  Needing to pee (urinate) more than usual.  Blurry vision. What do I need to know about low blood sugar? Low blood sugar is called hypoglycemia. This is when blood sugar is at or below 70 mg/dL (3.9 mmol/L). Symptoms may include:  Feeling:  Hungry.  Worried or nervous (anxious).  Sweaty and clammy.  Confused.  Dizzy.  Sleepy.  Sick to your stomach (nauseous).  Having:  A fast heartbeat.  A headache.  A change in your vision.  Jerky movements that you cannot control (seizure).  Nightmares.  Tingling or no feeling (numbness) around the mouth, lips, or tongue.  Having trouble with:  Talking.  Paying attention (concentrating).  Moving  (coordination).  Sleeping.  Shaking.  Passing out (fainting).  Getting upset easily (irritability). Treating low blood sugar  To treat low blood sugar, eat or drink something sugary right away. If you can think clearly and swallow safely, follow the 15:15 rule:  Take 15 grams of a fast-acting carb (carbohydrate). Some fast-acting carbs are:  1 tube of glucose gel.  3 sugar tablets (glucose pills).  6-8 pieces of hard candy.  4 oz (120 mL) of fruit juice.  4 oz (120 mL) regular (not diet) soda.  Check your blood sugar 15 minutes after you take the carb.  If your blood sugar is still at or below 70 mg/dL (3.9 mmol/L), take 15 grams of a carb again.  If your blood sugar does not go above 70 mg/dL (3.9 mmol/L) after 3 tries, get help right away.  After your blood sugar goes back to normal, eat a meal or a snack within 1 hour. Treating very low blood sugar  If your blood sugar is at or below 54 mg/dL (3 mmol/L), you have very low blood sugar (severe hypoglycemia). This is an emergency. Do not wait to see if the symptoms will go away. Get medical help right away. Call your local emergency services (911 in the [place] Do not drive yourself to the hospital. If you have very low blood sugar and you cannot eat or drink, you may need a glucagon shot (injection). A family member or friend should learn  how to check your blood sugar and how to give you a glucagon shot. Ask your doctor if you need to have a glucagon shot kit at home. What else is important to manage my diabetes? Medicine  Take insulin and diabetes medicines as told.  Adjust your insulin and medicines as told.  Do not run out of insulin or medicines. Having diabetes can put you at risk for other long-term (chronic) conditions. These may include heart disease and kidney disease. Your doctor may prescribe medicines to help prevent problems from diabetes. Food  Make healthy food choices. These include:  Chicken, fish, egg  whites, and beans.  Oats, whole wheat, bulgur, brown rice, quinoa, and millet.  Fresh fruits and vegetables.  Low-fat dairy products.  Nuts, avocado, olive oil, and canola oil.  Meet with a food specialist (registered dietitian). He or she can help you make an eating plan that is right for you.  Follow instructions from your doctor about what you cannot eat or drink.  Drink enough fluid to keep your pee (urine) clear or pale yellow.  Eat healthy snacks between healthy meals.  Keep track of carbs that you eat. Do this by reading food labels and learning food serving sizes.  Follow your sick day plan when you cannot eat or drink normally. Make this plan with your doctor so it is ready to use. Activity   Exercise at least 3 times a week.  Do not go more than 2 days without exercising.  Talk with your doctor before you start a new exercise. Your doctor may need to adjust your insulin, medicines, or food. Lifestyle   Do not use any tobacco products. These include cigarettes, chewing tobacco, and e-cigarettes. If you need help quitting, ask your doctor.  Ask your doctor how much alcohol is safe for you.  Learn to deal with stress. If you need help with this, ask your doctor. Body care  Stay up to date with your shots (immunizations).  Have your eyes and feet checked by a doctor as often as told.  Check your skin and feet every day. Check for cuts, bruises, redness, blisters, or sores.  Brush your teeth and gums two times a day, and floss at least one time a day.  Go to the dentist least one time every 6 months.  Stay at a healthy weight. General instructions   Take over-the-counter and prescription medicines only as told by your doctor.  Share your diabetes care plan with:  Your work or school.  People you live with.  Check your pee (urine) for ketones:  When you are sick.  As told by your doctor.  Carry a card or wear jewelry that says that you have  diabetes.  Ask your doctor:  Do I need to meet with a diabetes educator?  Where can I find a support group for people with diabetes?  Keep all follow-up visits as told by your doctor. This is important. Where to find more information: To learn more about diabetes, visit:  American Diabetes Association: www.diabetes.org  American Association of Diabetes Educators: www.diabeteseducator.org/patient-resources This information is not intended to replace advice given to you by your health care provider. Make sure you discuss any questions you have with your health care provider. Document Released: 07/04/2015 Document Revised: 08/18/2015 Document Reviewed: 04/15/2015 Elsevier Interactive Patient Education  2017 [employer].

## 2016-05-04 LAB — MICROALBUMIN / CREATININE URINE RATIO
CREATININE, URINE: 113 mg/dL (ref 20–320)
MICROALB/CREAT RATIO: 183 ug/mg{creat} — AB (ref <30)
Microalb, Ur: 20.7 mg/dL

## 2016-05-08 ENCOUNTER — Other Ambulatory Visit: Payer: Self-pay | Admitting: Family Medicine

## 2016-05-08 ENCOUNTER — Encounter: Payer: Self-pay | Admitting: Family Medicine

## 2016-05-09 ENCOUNTER — Telehealth: Payer: Self-pay

## 2016-05-09 ENCOUNTER — Other Ambulatory Visit: Payer: Self-pay | Admitting: Family Medicine

## 2016-05-09 DIAGNOSIS — E1065 Type 1 diabetes mellitus with hyperglycemia: Principal | ICD-10-CM

## 2016-05-09 DIAGNOSIS — IMO0001 Reserved for inherently not codable concepts without codable children: Secondary | ICD-10-CM

## 2016-05-09 NOTE — Telephone Encounter (Signed)
-----   Message from Lizbeth BarkMandesia R Hairston, FNP sent at 05/09/2016  1:12 PM EST ----- -Microalbumin/creatinine ratio level was elevated. This tests for protein in your urine that can indicate early signs of kidney damage. -I recommend starting on lisinopril 2.5 to help protect kidneys. You cannot begin lisinopril if you are pregnant or when trying to become pregnant. Urine pregnancy will need to be done in office prior to prescription being filled. -Kidney function at this time is normal. -Cholesterol levels are normal. -HgbA1c is 12.5. HgbA1c gives an average of your blood sugar levels over the last 3 months. Goal is to lower your HgbA1c levels to 7 or below.

## 2016-05-09 NOTE — Telephone Encounter (Signed)
CMA call to go over lab results   Patient did not answer, & no VM set up to leave a message

## 2016-05-10 ENCOUNTER — Ambulatory Visit: Payer: Medicaid Other | Admitting: Pharmacist

## 2016-05-10 NOTE — Telephone Encounter (Signed)
CMA call to inform about lab results  Patient Verify DOB  Patient was aware and understood  Patient has an upcoming appt so she going wait to do the pregnancy screen until then

## 2016-05-10 NOTE — Telephone Encounter (Signed)
-----   Message from Mandesia R Hairston, FNP sent at 05/09/2016  1:12 PM EST ----- -Microalbumin/creatinine ratio level was elevated. This tests for protein in your urine that can indicate early signs of kidney damage. -I recommend starting on lisinopril 2.5 to help protect kidneys. You cannot begin lisinopril if you are pregnant or when trying to become pregnant. Urine pregnancy will need to be done in office prior to prescription being filled. -Kidney function at this time is normal. -Cholesterol levels are normal. -HgbA1c is 12.5. HgbA1c gives an average of your blood sugar levels over the last 3 months. Goal is to lower your HgbA1c levels to 7 or below. 

## 2016-05-11 IMAGING — US US MFM OB FOLLOW-UP
1 series · 14 of 28 positions shown · non-contrast
Comparison: none

[Series 1: us mfm ob follow-up · 49 acquisitions, 14 frames shown]
[im 2/49]
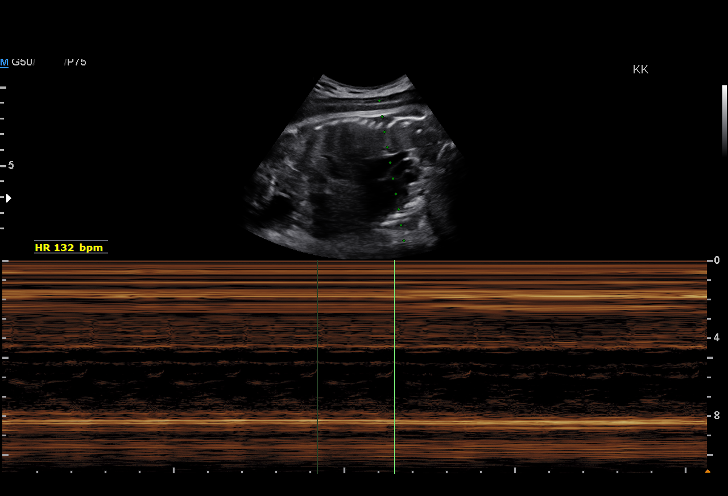
[im 6/49]
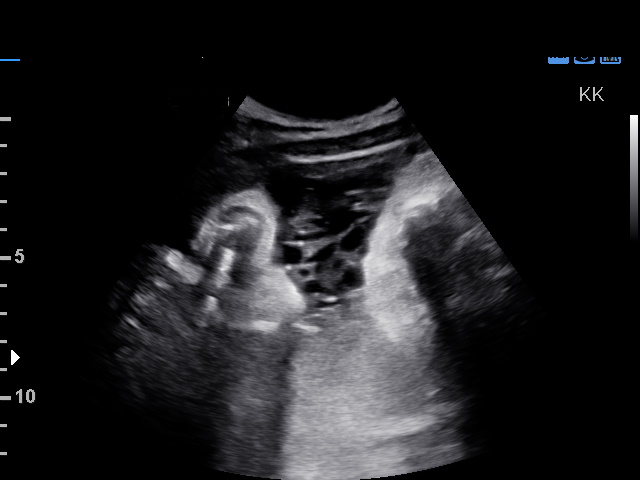
[im 9/49]
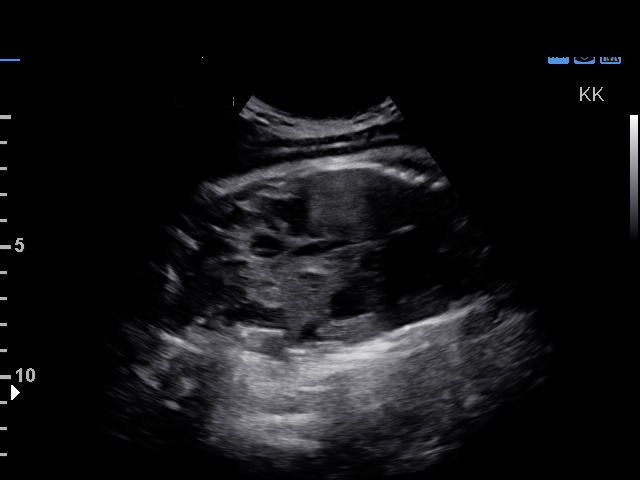
[im 13/49]
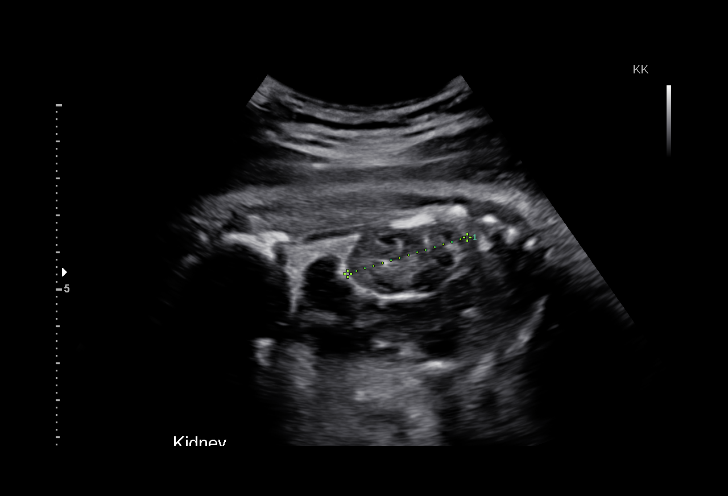
[im 17/49]
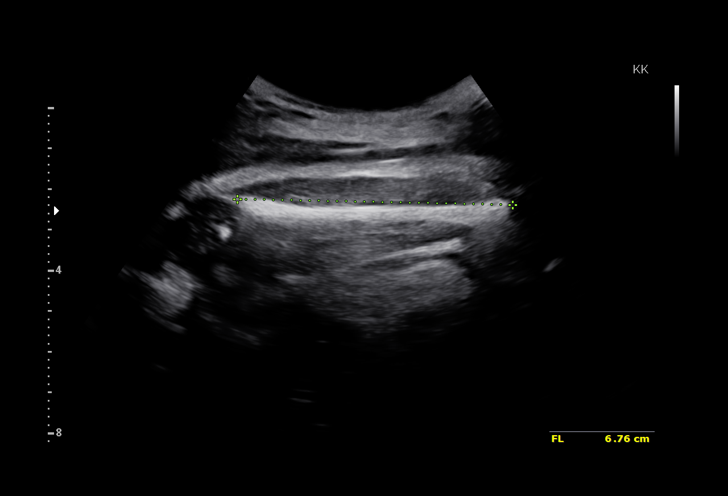
[im 20/49]
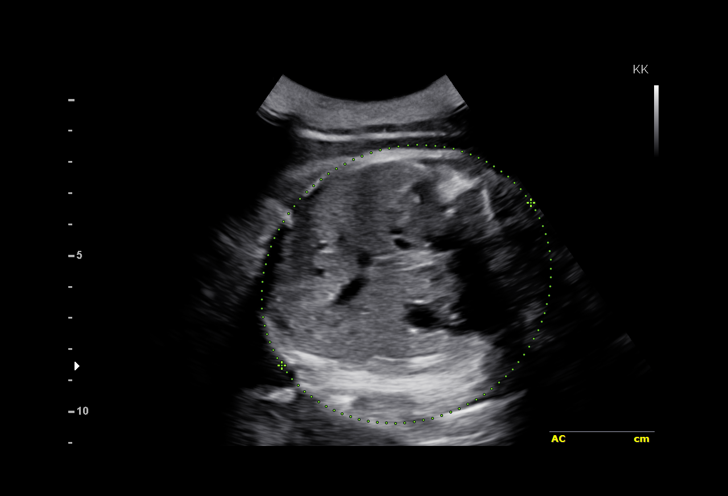
[im 24/49]
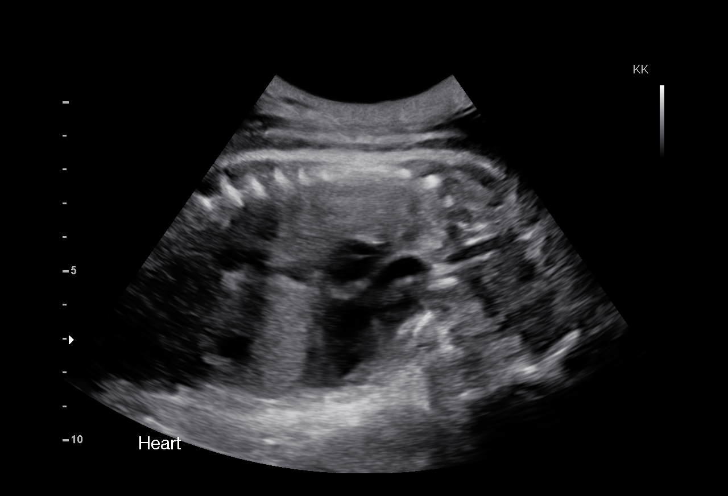
[im 27/49]
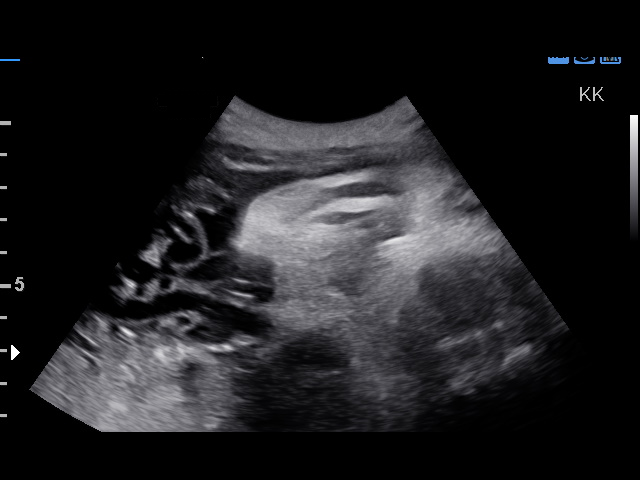
[im 31/49]
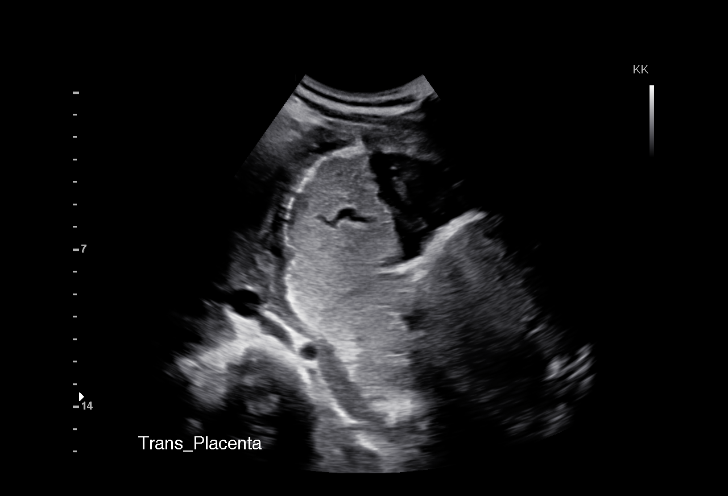
[im 34/49]
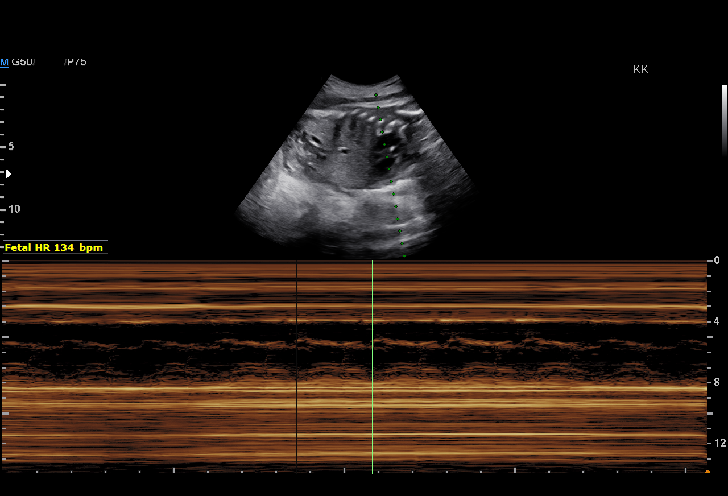
[im 38/49]
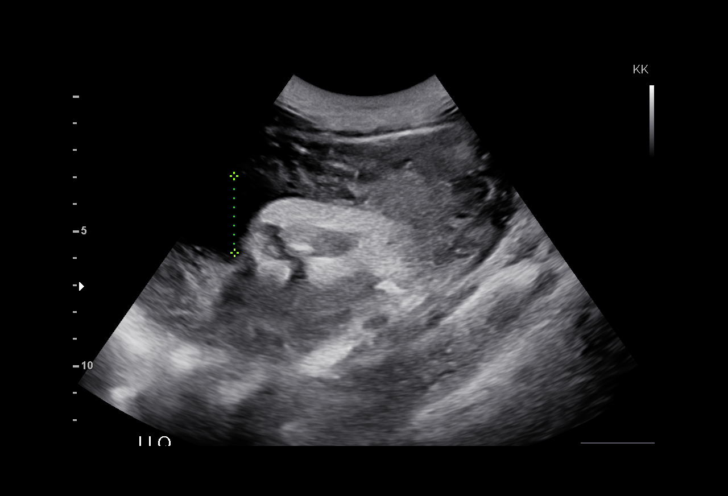
[im 41/49]
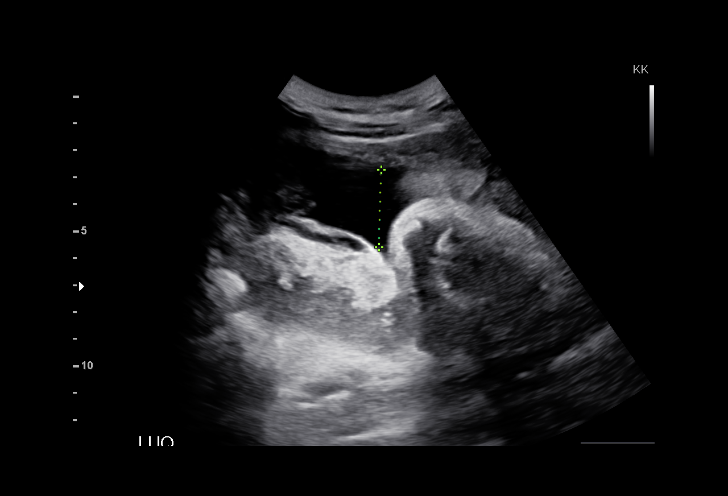
[im 45/49]
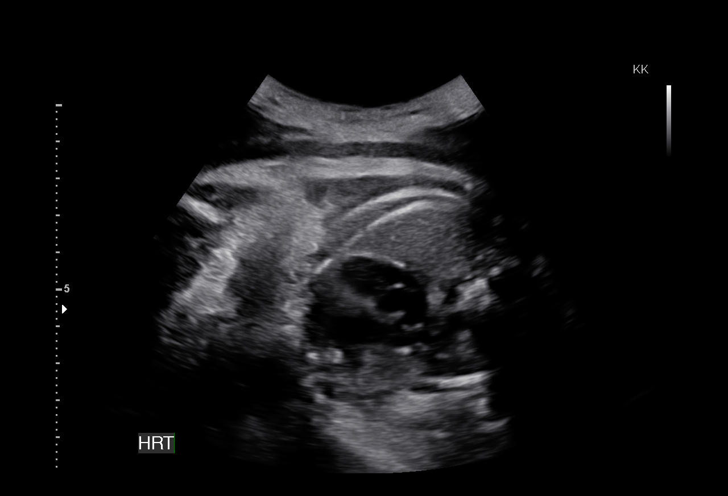
[im 49/49]
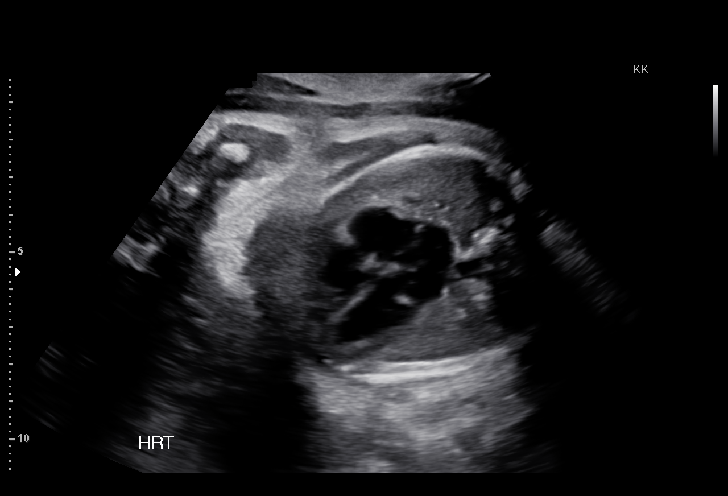

[14 of 28 positions shown; findings below may reference images not displayed]

am)

Date:

RDMS                                     d [id number]

1  ORLIK MAJO            [id number]       [id number]     [id number]
Indications

Pre-existing diabetes, type 1, in
pregnancy, third trimester
Low lying placenta, antepartum-resolved
Cervical shortening, third trimester
35 weeks gestation of pregnancy
OB History

Gravidity:     2         SAB:   1
Fetal Evaluation

Num Of Fetuses:      1
Fetal Heart          134
Rate(bpm):
Cardiac Activity:    Observed
Presentation:        Cephalic
Placenta:            Posterior, above cervical os
P. Cord Insertion:   Previously Visualized

Amniotic Fluid
AFI FV:      Subjectively low-normal
AFI Sum:     7.56     cm       4  %Tile     Larg Pckt:    2.87   cm
RUQ:   2.84    cm    LUQ:   1.85    cm   LLQ:    2.87    cm
Biometry

BPD:      77.8  mm     G. Age:   31w 2d                  CI:        71.53   %    70 - 86
FL/HC:      23.0   %    20.1 -
HC:      292.9  mm     G. Age:   32w 2d       < 3   %    HC/AC:      0.96        0.93 -
AC:      305.5  mm     G. Age:   34w 4d        32   %    FL/BPD      86.5   %    71 - 87
:
FL:       67.3  mm     G. Age:   34w 4d        24   %    FL/AC:      22.0   %    20 - 24

Est.        4760   gm    5 lb 2 oz      29   %
FW:
Gestational Age

LMP:           35w 3d        Date:  08/09/14                  EDD:   05/16/15
U/S Today:     33w 1d                                         EDD:   06/01/15
Best:          35w 3d    Det. By:   LMP  (08/09/14)           EDD:   05/16/15
Anatomy

Cranium:          Previously seen        Aortic Arch:       Previously seen
Fetal Cavum:      Previously seen        Ductal Arch:       Previously seen
Ventricles:       Previously seen        Diaphragm:         Appears normal
Choroid Plexus:   Previously seen        Stomach:           Appears normal,
left sided
Cerebellum:       Previously seen        Abdomen:           Previously seen
Posterior         Previously seen        Abdominal          Previously seen
Fossa:                                   Wall:
Nuchal Fold:      Not applicable (>20    Cord Vessels:      Previously seen
wks GA)
Face:             Orbits and profile     Kidneys:           Appear normal
previously seen
Lips:             Previously seen        Bladder:           Appears normal
Heart:            Appears normal         Spine:             Previously seen
(4CH, axis, and
situs)
RVOT:             Appears normal         Upper              Previously seen
Extremities:
LVOT:             Appears normal         Lower              Previously seen
Extremities:

Other:   Parents do not wish to know sex of fetus. Left heel and 5th digit
previously visualized.
Cervix Uterus Adnexa

Cervix
Not visualized (advanced GA >25wks)
Impression

Singleton intrauterine pregnancy at 35 weeks 3 days
gestation with fetal cardiac activity
Cephalic presentation
Normal appearing fetal growth and amniotic fluid volume
Recommendations

Based on prior note, should be on antenatal tesing next
week- 2x weekly NST with weekly evaluation of amniotic
fluid volume
Ultrasound for interval growth in 4 weeks if undelivered

## 2016-05-17 ENCOUNTER — Ambulatory Visit: Payer: Medicaid Other | Admitting: Pharmacist

## 2016-08-10 ENCOUNTER — Encounter: Payer: Self-pay | Admitting: Family Medicine

## 2016-08-10 ENCOUNTER — Ambulatory Visit: Payer: Medicaid Other | Attending: Family Medicine | Admitting: Family Medicine

## 2016-08-10 VITALS — BP 112/78 | HR 89 | Temp 98.5°F | Resp 16 | Wt 119.6 lb

## 2016-08-10 DIAGNOSIS — R21 Rash and other nonspecific skin eruption: Secondary | ICD-10-CM | POA: Diagnosis not present

## 2016-08-10 DIAGNOSIS — L309 Dermatitis, unspecified: Secondary | ICD-10-CM | POA: Insufficient documentation

## 2016-08-10 DIAGNOSIS — E1065 Type 1 diabetes mellitus with hyperglycemia: Secondary | ICD-10-CM | POA: Diagnosis not present

## 2016-08-10 DIAGNOSIS — E119 Type 2 diabetes mellitus without complications: Secondary | ICD-10-CM

## 2016-08-10 DIAGNOSIS — Z79899 Other long term (current) drug therapy: Secondary | ICD-10-CM | POA: Insufficient documentation

## 2016-08-10 DIAGNOSIS — Z794 Long term (current) use of insulin: Secondary | ICD-10-CM | POA: Diagnosis present

## 2016-08-10 DIAGNOSIS — E109 Type 1 diabetes mellitus without complications: Secondary | ICD-10-CM | POA: Insufficient documentation

## 2016-08-10 LAB — GLUCOSE, POCT (MANUAL RESULT ENTRY): POC GLUCOSE: 264 mg/dL — AB (ref 70–99)

## 2016-08-10 LAB — POCT GLYCOSYLATED HEMOGLOBIN (HGB A1C): Hemoglobin A1C: 12

## 2016-08-10 MED ORDER — TRIAMCINOLONE ACETONIDE 0.1 % EX CREA: 1.0000 "application " | TOPICAL_CREAM | Freq: Two times a day (BID) | CUTANEOUS | 0 refills | Status: AC

## 2016-08-10 MED ORDER — HYDROXYZINE HCL 25 MG PO TABS: 25.0000 mg | ORAL_TABLET | Freq: Three times a day (TID) | ORAL | 0 refills | Status: AC | PRN

## 2016-08-10 MED ORDER — PERMETHRIN 5 % EX CREA: 1.0000 "application " | TOPICAL_CREAM | Freq: Once | CUTANEOUS | 0 refills | Status: AC

## 2016-08-10 NOTE — Progress Notes (Signed)
Subjective:  Patient ID: Sara Warren, female    DOB: 01/24/1991  Age: 26 y.o. MRN: [id number]  CC: Rash   HPI Sara Warren presents for   Dermatitis: Patient complains of a rash. Symptoms began 4 months ago. Patient describes the rash as maculopapular. Characteristics of rash and associated history: Is rash pruritic?  yes, Alleviating factors? none Aggravating factors? Yes, water exposure. Any associated:fever, GI symptoms, joint pain, Similar problem in household members? no, Skin exposure to potential irritants at home or work or from hobbies? no, Personal hx of allergies, asthma or hay fever? no, Recent exposure to animals? no. Patient's previous dermatologic history includes none.  Medications currently using: moisturizing cream. Environmental exposures or allergies: none    Diabetes Mellitus: Patient presents history of type 1 diabetes. She is being followed by an endocrinologist for diabetes management. She denies nany foot ulcerations, nausea, paresthesia of the feet, visual disturbances and vomitting.  Evaluation to date has been included: fasting blood sugar, fasting lipid panel and hemoglobin A1C.  Home sugars: BGs are running  consistent with Hgb A1C.     Outpatient Medications Prior to Visit  Medication Sig Dispense Refill  . insulin aspart (NOVOLOG) 100 unit/mL injection Inject 1-25 Units into the skin 3 (three) times daily before meals. Take one unit per 10 grams carbs    . insulin detemir (LEVEMIR) 100 UNIT/ML injection Use 17 units in the morning and 15 units at night. 10 mL 2  . ACCU-CHEK SOFTCLIX LANCETS lancets Use as instructed 100 each 12  . Blood Glucose Monitoring Suppl (ACCU-CHEK AVIVA PLUS) w/Device KIT Use as directed 1 kit 0  . glucagon (GLUCAGON EMERGENCY) 1 MG injection Inject as directed as directed.    Marland Kitchen glucose blood (ACCU-CHEK AVIVA PLUS) test strip Use as instructed 100 each 12  . Insulin Syringe-Needle U-100 25G X 1" 1 ML MISC For 4 times a day insulin SQ,  1 month supply. Diagnosis E11.65  Any brand that is covered. 30 each 0  . Lancets Misc. (ACCU-CHEK SOFTCLIX LANCET DEV) KIT Use as directed 1 kit 0   No facility-administered medications prior to visit.     ROS Review of Systems  Constitutional: Negative.   Eyes: Negative.   Respiratory: Negative.   Cardiovascular: Negative.   Gastrointestinal: Negative.   Skin: Positive for rash.  Neurological: Negative.   Psychiatric/Behavioral: Negative.     Objective:  BP 112/78 (BP Location: Left Arm, Patient Position: Sitting, Cuff Size: Normal)   Pulse 89   Temp 98.5 F (36.9 C) (Oral)   Resp 16   Wt 119 lb 9.6 oz (54.3 kg)   SpO2 99%   BMI 19.01 kg/m   BP/Weight 08/10/2016 05/03/2016 3/53/2992  Systolic BP 426 834 196  Diastolic BP 78 71 68  Wt. (Lbs) 119.6 118.8 -  BMI 19.01 18.89 -   Physical Exam  Constitutional: She appears well-developed and well-nourished.  Eyes: Conjunctivae are normal. Pupils are equal, round, and reactive to light.  Cardiovascular: Normal rate, regular rhythm, normal heart sounds and intact distal pulses.   Pulmonary/Chest: Effort normal and breath sounds normal.  Abdominal: Soft. Bowel sounds are normal.  Skin: Skin is warm and dry. Rash noted. Rash is maculopapular (bilateral hands; no erythema present).  Psychiatric: She has a normal mood and affect.  Nursing note and vitals reviewed.   Assessment & Plan:   Problem List Items Addressed This Visit      Endocrine   Type 1 diabetes ([place])   Relevant  Orders   Glucose (CBG) (Completed)   HgB A1c (Completed)   Diabetes mellitus without complication (HCC)    Other Visit Diagnoses    Dermatitis    -  Primary   Suspect symptoms are related to eczema of the hand however due to chronic nature of symptoms    and presentation of rash will cover for possibility of scabies exposure as well.    Relevant Medications   triamcinolone cream (KENALOG) 0.1 %            permethrin (ELIMITE) 5 % cream    hydrOXYzine (ATARAX/VISTARIL) 25 MG tablet      Meds ordered this encounter  Medications  . permethrin (ELIMITE) 5 % cream    Sig: Apply 1 application topically once. Apply from neck to soles of feet. Leave on for 8-14 hours then rinse off. If needed  repeat in 14 days.    Dispense:  60 g    Refill:  0    Order Specific Question:   Supervising Provider    Answer:   Tresa Garter [id number]  . triamcinolone cream (KENALOG) 0.1 %    Sig: Apply 1 application topically 2 (two) times daily. For 14 days. Then twice a day as needed.    Dispense:  454 g    Refill:  0    Order Specific Question:   Supervising Provider    Answer:   Tresa Garter [id number]  . hydrOXYzine (ATARAX/VISTARIL) 25 MG tablet    Sig: Take 1 tablet (25 mg total) by mouth every 8 (eight) hours as needed for itching.    Dispense:  30 tablet    Refill:  0    Order Specific Question:   Supervising Provider    Answer:   Tresa Garter [id number]    Follow-up: Return if symptoms worsen or fail to improve.   Alfonse Spruce FNP

## 2016-08-10 NOTE — Patient Instructions (Addendum)
Do not breastfeed while taking permethrin cream or hydroxyzine pills.    Hand Dermatitis Hand dermatitis is a skin condition. It causes small, itchy, raised dots or fluid-filled blisters to form on the palms of the hands. This condition may also be called hand eczema. Follow these instructions at home:  Take or apply over-the-counter and prescription medicines only as told by your doctor.  If you were prescribed an antibiotic medicine, use it as told by your doctor. Do not stop using the antibiotic even if you start to feel better.  Avoid washing your hands more often than you need to.  Avoid using harsh chemicals on your hands.  Wear gloves that protect your hands when you handle products that can bother (irritate) your skin.  Keep all follow-up visits as told by your doctor. This is important. Contact a doctor if:  Your rash is not better after one week of treatment.  Your rash is red.  Your rash is tender.  Your rash has pus coming from it.  Your rash spreads. This information is not intended to replace advice given to you by your health care provider. Make sure you discuss any questions you have with your health care provider. Document Released: 06/06/2009 Document Revised: 08/18/2015 Document Reviewed: 09/24/2014 Elsevier Interactive Patient Education  2017 [employer]  Eczema Eczema, also called atopic dermatitis, is a skin disorder that causes inflammation of the skin. It causes a red rash and dry, scaly skin. The skin becomes very itchy. Eczema is generally worse during the cooler winter months and often improves with the warmth of summer. Eczema usually starts showing signs in infancy. Some children outgrow eczema, but it may last through adulthood. What are the causes? The exact cause of eczema is not known, but it appears to run in families. People with eczema often have a family history of eczema, allergies, asthma, or hay fever. Eczema is not contagious. Flare-ups  of the condition may be caused by: Contact with something you are sensitive or allergic to. Stress. What are the signs or symptoms? Dry, scaly skin. Red, itchy rash. Itchiness. This may occur before the skin rash and may be very intense. How is this diagnosed? The diagnosis of eczema is usually made based on symptoms and medical history. How is this treated? Eczema cannot be cured, but symptoms usually can be controlled with treatment and other strategies. A treatment plan might include: Controlling the itching and scratching. Use over-the-counter antihistamines as directed for itching. This is especially useful at night when the itching tends to be worse. Use over-the-counter steroid creams as directed for itching. Avoid scratching. Scratching makes the rash and itching worse. It may also result in a skin infection (impetigo) due to a break in the skin caused by scratching. Keeping the skin well moisturized with creams every day. This will seal in moisture and help prevent dryness. Lotions that contain alcohol and water should be avoided because they can dry the skin. Limiting exposure to things that you are sensitive or allergic to (allergens). Recognizing situations that cause stress. Developing a plan to manage stress. Follow these instructions at home: Only take over-the-counter or prescription medicines as directed by your health care provider. Do not use anything on the skin without checking with your health care provider. Keep baths or showers short (5 minutes) in warm (not hot) water. Use mild cleansers for bathing. These should be unscented. You may add nonperfumed bath oil to the bath water. It is best to avoid soap and  bubble bath. Immediately after a bath or shower, when the skin is still damp, apply a moisturizing ointment to the entire body. This ointment should be a petroleum ointment. This will seal in moisture and help prevent dryness. The thicker the ointment, the better.  These should be unscented. Keep fingernails cut short. Children with eczema may need to wear soft gloves or mittens at night after applying an ointment. Dress in clothes made of cotton or cotton blends. Dress lightly, because heat increases itching. A child with eczema should stay away from anyone with fever blisters or cold sores. The virus that causes fever blisters (herpes simplex) can cause a serious skin infection in children with eczema. Contact a health care provider if: Your itching interferes with sleep. Your rash gets worse or is not better within 1 week after starting treatment. You see pus or soft yellow scabs in the rash area. You have a fever. You have a rash flare-up after contact with someone who has fever blisters. This information is not intended to replace advice given to you by your health care provider. Make sure you discuss any questions you have with your health care provider. Document Released: 03/09/2000 Document Revised: 08/18/2015 Document Reviewed: 10/13/2012 Elsevier Interactive Patient Education  2017 [employer]  Scabies, Adult Scabies is a skin condition that happens when very small insects get under the skin (infestation). This causes a rash and severe itchiness. Scabies can spread from person to person (is contagious). If you get scabies, it is common for others in your household to get scabies too. With proper treatment, symptoms usually go away in 2-4 weeks. Scabies usually does not cause lasting problems. What are the causes? This condition is caused by mites (Sarcoptes scabiei, or human itch mites) that can only be seen with a microscope. The mites get into the top layer of skin and lay eggs. Scabies can spread from person to person through:  Close contact with a person who has scabies.  Contact with infested items, such as towels, bedding, or clothing. What increases the risk? This condition is more likely to develop in:  People who live in nursing  homes and other extended-care facilities.  People who have sexual contact with a partner who has scabies.  Young children who attend child care facilities.  People who care for others who are at increased risk for scabies. What are the signs or symptoms? Symptoms of this condition may include:  Severe itchiness. This is often worse at night.  A rash that includes tiny red bumps or blisters. The rash commonly occurs on the wrist, elbow, armpit, fingers, waist, groin, or buttocks. Bumps may form a line (burrow) in some areas.  Skin irritation. This can include scaly patches or sores. How is this diagnosed? This condition is diagnosed with a physical exam. Your health care provider will look closely at your skin. In some cases, your health care provider may take a sample of your affected skin (skin scraping) and have it examined under a microscope. How is this treated? This condition may be treated with:  Medicated cream or lotion that kills the mites. This is spread on the entire body and left on for several hours. Usually, one treatment with medicated cream or lotion is enough to kill all of the mites. In severe cases, the treatment may be repeated.  Medicated cream that relieves itching.  Medicines that help to relieve itching.  Medicines that kill the mites. This treatment is rarely used. Follow these instructions at  home:   Medicines   Take or apply over-the-counter and prescription medicines as told by your health care provider.  Apply medicated cream or lotion as told by your health care provider.  Do not wash off the medicated cream or lotion until the necessary amount of time has passed. Skin Care   Avoid scratching your affected skin.  Keep your fingernails closely trimmed to reduce injury from scratching.  Take cool baths or apply cool washcloths to help reduce itching. General instructions   Clean all items that you recently had contact with, including bedding,  clothing, and furniture. Do this on the same day that your treatment starts.  Use hot water when you wash items.  Place unwashable items into closed, airtight plastic bags for at least 3 days. The mites cannot live for more than 3 days away from human skin.  Vacuum furniture and mattresses that you use.  Make sure that other people who may have been infested are examined by a health care provider. These include members of your household and anyone who may have had contact with infested items.  Keep all follow-up visits as told by your health care provider. This is important. Contact a health care provider if:  You have itching that does not go away after 4 weeks of treatment.  You continue to develop new bumps or burrows.  You have redness, swelling, or pain in your rash area after treatment.  You have fluid, blood, or pus coming from your rash. This information is not intended to replace advice given to you by your health care provider. Make sure you discuss any questions you have with your health care provider. Document Released: 12/01/2014 Document Revised: 08/18/2015 Document Reviewed: 10/12/2014 Else[employer] Interactive Patient Education  2017 [employer].
